# Patient Record
Sex: Male | Born: 1935 | Race: White | Hispanic: No | Smoking: Former smoker
Health system: Southern US, Community
[De-identification: ages and names within clinical notes are randomized; demographics above are authoritative.]

## PROBLEM LIST (undated history)

## (undated) DIAGNOSIS — R030 Elevated blood-pressure reading, without diagnosis of hypertension: Secondary | ICD-10-CM

## (undated) DIAGNOSIS — I2510 Atherosclerotic heart disease of native coronary artery without angina pectoris: Principal | ICD-10-CM

## (undated) DIAGNOSIS — N400 Enlarged prostate without lower urinary tract symptoms: Secondary | ICD-10-CM

## (undated) DIAGNOSIS — E785 Hyperlipidemia, unspecified: Secondary | ICD-10-CM

## (undated) DIAGNOSIS — Z87442 Personal history of urinary calculi: Secondary | ICD-10-CM

## (undated) DIAGNOSIS — I4891 Unspecified atrial fibrillation: Secondary | ICD-10-CM

## (undated) DIAGNOSIS — I340 Nonrheumatic mitral (valve) insufficiency: Secondary | ICD-10-CM

## (undated) HISTORY — DX: Benign prostatic hyperplasia without lower urinary tract symptoms: N40.0

## (undated) HISTORY — PX: TONSILECTOMY, ADENOIDECTOMY, BILATERAL MYRINGOTOMY AND TUBES: SHX2538

## (undated) HISTORY — DX: Elevated blood-pressure reading, without diagnosis of hypertension: R03.0

## (undated) HISTORY — DX: Unspecified atrial fibrillation: I48.91

## (undated) HISTORY — DX: Atherosclerotic heart disease of native coronary artery without angina pectoris: I25.10

## (undated) HISTORY — DX: Nonrheumatic mitral (valve) insufficiency: I34.0

## (undated) HISTORY — DX: Hyperlipidemia, unspecified: E78.5

## (undated) HISTORY — PX: CORONARY ARTERY BYPASS GRAFT: SHX141

## (undated) HISTORY — PX: FACIAL FRACTURE SURGERY: SHX1570

---

## 2004-04-24 ENCOUNTER — Ambulatory Visit: Payer: Self-pay | Admitting: Internal Medicine

## 2004-05-08 ENCOUNTER — Ambulatory Visit: Payer: Self-pay | Admitting: Internal Medicine

## 2004-05-09 ENCOUNTER — Ambulatory Visit: Payer: Self-pay | Admitting: Internal Medicine

## 2004-05-10 ENCOUNTER — Ambulatory Visit: Payer: Self-pay | Admitting: Internal Medicine

## 2004-05-10 ENCOUNTER — Encounter: Payer: Self-pay | Admitting: Internal Medicine

## 2004-05-10 LAB — HM COLONOSCOPY

## 2004-05-15 ENCOUNTER — Ambulatory Visit: Payer: Self-pay | Admitting: Internal Medicine

## 2004-05-22 ENCOUNTER — Ambulatory Visit: Payer: Self-pay | Admitting: Internal Medicine

## 2004-06-19 ENCOUNTER — Ambulatory Visit: Payer: Self-pay | Admitting: Internal Medicine

## 2004-07-17 ENCOUNTER — Ambulatory Visit: Payer: Self-pay | Admitting: Internal Medicine

## 2004-07-30 ENCOUNTER — Ambulatory Visit: Payer: Self-pay | Admitting: Internal Medicine

## 2005-02-27 ENCOUNTER — Ambulatory Visit: Payer: Self-pay | Admitting: Internal Medicine

## 2006-02-06 ENCOUNTER — Ambulatory Visit: Payer: Self-pay | Admitting: Internal Medicine

## 2007-12-16 ENCOUNTER — Ambulatory Visit: Payer: Self-pay | Admitting: Internal Medicine

## 2007-12-30 ENCOUNTER — Ambulatory Visit: Payer: Self-pay | Admitting: Internal Medicine

## 2007-12-30 DIAGNOSIS — E785 Hyperlipidemia, unspecified: Secondary | ICD-10-CM | POA: Insufficient documentation

## 2007-12-30 DIAGNOSIS — F528 Other sexual dysfunction not due to a substance or known physiological condition: Secondary | ICD-10-CM

## 2007-12-30 DIAGNOSIS — N400 Enlarged prostate without lower urinary tract symptoms: Secondary | ICD-10-CM | POA: Insufficient documentation

## 2008-01-05 ENCOUNTER — Telehealth (INDEPENDENT_AMBULATORY_CARE_PROVIDER_SITE_OTHER): Payer: Self-pay | Admitting: *Deleted

## 2008-01-06 LAB — CONVERTED CEMR LAB
AST: 26 units/L (ref 0–37)
BUN: 20 mg/dL (ref 6–23)
Basophils Absolute: 0.1 10*3/uL (ref 0.0–0.1)
Basophils Relative: 2.4 % (ref 0.0–3.0)
Calcium: 9.4 mg/dL (ref 8.4–10.5)
Chloride: 105 meq/L (ref 96–112)
Creatinine, Ser: 1.4 mg/dL (ref 0.4–1.5)
Eosinophils Absolute: 0.1 10*3/uL (ref 0.0–0.7)
Eosinophils Relative: 2.7 % (ref 0.0–5.0)
GFR calc Af Amer: 64 mL/min
GFR calc non Af Amer: 53 mL/min
HCT: 49 % (ref 39.0–52.0)
HDL: 38.4 mg/dL — ABNORMAL LOW (ref >39.0)
MCHC: 34.9 g/dL (ref 30.0–36.0)
MCV: 90.8 fL (ref 78.0–100.0)
Monocytes Absolute: 0.6 10*3/uL (ref 0.1–1.0)
Neutro Abs: 2.5 10*3/uL (ref 1.4–7.7)
Neutrophils Relative %: 50.9 % (ref 43.0–77.0)
PSA: 9.86 ng/mL — ABNORMAL HIGH (ref 0.10–4.00)
RBC: 5.4 M/uL (ref 4.22–5.81)
WBC: 4.8 10*3/uL (ref 4.5–10.5)

## 2008-01-11 ENCOUNTER — Telehealth: Payer: Self-pay | Admitting: Internal Medicine

## 2008-01-26 ENCOUNTER — Encounter: Payer: Self-pay | Admitting: Internal Medicine

## 2009-01-05 ENCOUNTER — Ambulatory Visit: Payer: Self-pay | Admitting: Internal Medicine

## 2009-05-19 ENCOUNTER — Ambulatory Visit: Payer: Self-pay | Admitting: Internal Medicine

## 2009-05-19 DIAGNOSIS — R030 Elevated blood-pressure reading, without diagnosis of hypertension: Secondary | ICD-10-CM

## 2009-05-19 DIAGNOSIS — I10 Essential (primary) hypertension: Secondary | ICD-10-CM

## 2009-05-19 HISTORY — DX: Elevated blood-pressure reading, without diagnosis of hypertension: R03.0

## 2009-06-05 ENCOUNTER — Encounter: Payer: Self-pay | Admitting: Internal Medicine

## 2010-01-08 ENCOUNTER — Ambulatory Visit: Payer: Self-pay | Admitting: Internal Medicine

## 2010-03-25 DIAGNOSIS — I340 Nonrheumatic mitral (valve) insufficiency: Secondary | ICD-10-CM

## 2010-03-25 HISTORY — PX: CARDIOVERSION: SHX1299

## 2010-03-25 HISTORY — DX: Nonrheumatic mitral (valve) insufficiency: I34.0

## 2010-04-22 LAB — CONVERTED CEMR LAB
Calcium: 9.6 mg/dL (ref 8.4–10.5)
Direct LDL: 176.9 mg/dL
GFR calc non Af Amer: 52.86 mL/min (ref >60)
Glucose, Bld: 76 mg/dL (ref 70–99)
HDL: 46 mg/dL (ref >39.00)
Potassium: 4.4 meq/L (ref 3.5–5.1)
Sodium: 143 meq/L (ref 135–145)
Triglycerides: 105 mg/dL (ref 0.0–149.0)
VLDL: 21 mg/dL (ref 0.0–40.0)

## 2010-04-24 NOTE — Assessment & Plan Note (Signed)
Summary: FLU SHOT//PH  Nurse Visit  CC: Flu shot./kb   Allergies: No Known Drug Allergies  Orders Added: 1)  Flu Vaccine 63yrs + MEDICARE PATIENTS [Q2039] 2)  Administration Flu vaccine - MCR [G0008]          Flu Vaccine Consent Questions     Do you have a history of severe allergic reactions to this vaccine? no    Any prior history of allergic reactions to egg and/or gelatin? no    Do you have a sensitivity to the preservative Thimersol? no    Do you have a past history of Guillan-Barre Syndrome? no    Do you currently have an acute febrile illness? no    Have you ever had a severe reaction to latex? no    Vaccine information given and explained to patient? yes    Are you currently pregnant? no    Lot Number:[id number]   Exp Date:09/22/2010   Site Given  Left Deltoid IM

## 2010-04-24 NOTE — Assessment & Plan Note (Signed)
Summary: yearly-- - jr   Vital Signs:  Patient profile:   76 year old male Height:      73 inches Weight:      201.6 pounds BMI:     26.69 Pulse rate:   74 / minute BP sitting:   160 / 100  (left arm)  Vitals Entered By: Doristine Devoid (May 19, 2009 1:10 PM) CC: CPX- pt fasting    History of Present Illness: BP elevated today-- ambulatory BPs taken daily, gets readings of 120 to 130 BPH-- last PSA 1-11 was 12.5  hyperlipidemia-- on no meds , has a healthy lifestyle  yearly, chart reviewed    Allergies: No Known Drug Allergies  Past History:  Past Medical History: Reviewed history from 12/30/2007 and no changes required. Hyperlipidemia BPH prostate Bx aprox 2005  Past Surgical History: Reviewed history from 12/30/2007 and no changes required. Tonsillectomy Facial Fracture  Family History: Reviewed history from 12/30/2007 and no changes required. F - DECEASED OLD AGE M- DECEASED OLD AGE CAD - no HTN - F, PGM DM - no stroke - no colon Ca - no prostate Ca - no  Social History: Married 2 children Retired diet-- healthy exercise -- active, does some weight lifting, golf  Review of Systems General:  Denies fever and weight loss. CV:  Denies chest pain or discomfort and swelling of feet. Resp:  Denies cough and shortness of breath. GI:  Denies bloody stools. GU:  Denies hematuria and urinary hesitancy. Psych:  Denies anxiety and depression.  Physical Exam  General:  alert, well-developed, and well-nourished.   Neck:  no thyromegaly and normal carotid upstroke.   Lungs:  normal respiratory effort, no intercostal retractions, no accessory muscle use, and normal breath sounds.   Heart:  normal rate, regular rhythm, and no murmur.   Abdomen:  soft, non-tender, no distention, no masses, and no guarding.   Extremities:  no low or extremity edema Psych:  Cognition and judgment appear intact. Alert and cooperative with normal attention span and  concentration.  not anxious appearing and not depressed appearing.     Impression & Recommendations:  Problem # 1:  BENIGN PROSTATIC HYPERTROPHY (ICD-600.00) elevated PSA, see HPI to have a Bx soon   Problem # 2:  HEALTH SCREENING (ICD-V70.0) Td < 10 years ago pneumonia shot 4 to 5 years ago , repeat one today printed material provided regards shingles shot   colonoscopy 05/10/2004, he had polyps and  internal hemorrhoids; biopsy show hyperplastic polyps, Dr Leone Payor next 10 years   cont. healthy life style  Problem # 3:  ELEVATED BLOOD PRESSURE WITHOUT DIAGNOSIS OF HYPERTENSION (ICD-796.2)  good ambulatory BPs, continue monitoring EKG normal  Orders: EKG w/ Interpretation (93000) TLB-BMP (Basic Metabolic Panel-BMET) (80048-METABOL)  Problem # 4:  HYPERLIPIDEMIA (ICD-272.4)  on lifestyle modification only Labs Reviewed: SGOT: 26 (12/30/2007)   SGPT: 32 (12/30/2007)   HDL:38.4 (12/30/2007)  LDL:DEL (12/30/2007)  Chol:213 (12/30/2007)  Trig:78 (12/30/2007)  Orders: Venipuncture (04540) TLB-Lipid Panel (80061-LIPID)  Problem # 5:  ERECTILE DYSFUNCTION (ICD-302.72) needs a RF His updated medication list for this problem includes:    Viagra 100 Mg Tabs (Sildenafil citrate) .Marland Kitchen... 1 by mouth once daily as needed  Complete Medication List: 1)  Viagra 100 Mg Tabs (Sildenafil citrate) .Marland Kitchen.. 1 by mouth once daily as needed  Other Orders: Pneumococcal Vaccine (98119) Admin 1st Vaccine (14782)  Patient Instructions: 1)  Please schedule a follow-up appointment in 1 year.  Prescriptions: VIAGRA 100 MG TABS (SILDENAFIL CITRATE)  1 by mouth once daily as needed  #10 x 6   Entered and Authorized by:   Elita Quick E. Ame Heagle MD   Signed by:   Nolon Rod. Mylea Roarty MD on 05/19/2009   Method used:   Print then Give to Patient   RxID:   [id number]    Immunizations Administered:  Pneumonia Vaccine:    Vaccine Type: Pneumovax    Site: right deltoid    Mfr: Merck    Dose: 0.5 ml    Route:  IM    Given by: Doristine Devoid    Exp. Date: 07/13/2010    Lot #: [id number]

## 2010-04-24 NOTE — Procedures (Signed)
Summary: Colonoscopy/[facility]  Colonoscopy/[facility]   Imported By: Lanelle Bal 05/25/2009 14:29:33  _____________________________________________________________________  External Attachment:    Type:   Image     Comment:   External Document

## 2010-08-02 DIAGNOSIS — I2510 Atherosclerotic heart disease of native coronary artery without angina pectoris: Secondary | ICD-10-CM

## 2010-12-11 DIAGNOSIS — I2510 Atherosclerotic heart disease of native coronary artery without angina pectoris: Secondary | ICD-10-CM

## 2011-01-29 ENCOUNTER — Encounter: Payer: Self-pay | Admitting: Thoracic Surgery (Cardiothoracic Vascular Surgery)

## 2011-01-29 ENCOUNTER — Ambulatory Visit (INDEPENDENT_AMBULATORY_CARE_PROVIDER_SITE_OTHER): Payer: Medicare Other | Admitting: Physician Assistant

## 2011-01-29 DIAGNOSIS — I2510 Atherosclerotic heart disease of native coronary artery without angina pectoris: Secondary | ICD-10-CM

## 2011-01-29 DIAGNOSIS — Z951 Presence of aortocoronary bypass graft: Secondary | ICD-10-CM

## 2011-01-29 NOTE — Progress Notes (Signed)
[name] return to the office today for scheduled followup after five-vessel coronary bypass grafting by Dr. Arvilla Buchanan on 12/10/2010. He had severe multivessel coronary disease included a totally occluded right coronary artery. His ejection fraction was 50% prior to  surgery and following cardiac pulmonary bypass. His postoperative course was uneventful except for some atrial fibrillation which was treated with amiodarone. Since his discharge home he is continuing to progress. He denies any chest pain or shortness of breath. He tells me he was seen by Dr. Tollie Buchanan within the past few weeks and his amiodarone dose was increased to 3 times daily. He was also started on diuretic because he had some lower extremity swelling.  On physical exam: [name] is in no acute distress. His heart is in a regular rate and rhythm. His breath sounds are clear to auscultation. Sternotomy incision is well healed and the sternum is stable. Chest tube sutures are removed today. He has no edema in the left leg from where the saphenous vein was harvested. He does have some edema in the right leg. He has no calf or thigh tenderness. He denies any pain in either extremity.  Impression: Overall [name] is making satisfactory recovery after five-vessel coronary bypass grafting. He has scheduled followup with Dr. Reuel Buchanan next month. He did not bring his medications with him today but he reports no changes other than those mentioned above. No further followup scheduled in our office but he understands we would be happy to see him should any other problems arise related to his surgery.

## 2011-02-21 ENCOUNTER — Ambulatory Visit (INDEPENDENT_AMBULATORY_CARE_PROVIDER_SITE_OTHER): Payer: Medicare Other

## 2011-02-21 DIAGNOSIS — Z23 Encounter for immunization: Secondary | ICD-10-CM

## 2011-04-02 DIAGNOSIS — I252 Old myocardial infarction: Secondary | ICD-10-CM | POA: Diagnosis not present

## 2011-04-02 DIAGNOSIS — Z951 Presence of aortocoronary bypass graft: Secondary | ICD-10-CM | POA: Diagnosis not present

## 2011-04-02 DIAGNOSIS — I059 Rheumatic mitral valve disease, unspecified: Secondary | ICD-10-CM | POA: Diagnosis not present

## 2011-04-02 DIAGNOSIS — I2510 Atherosclerotic heart disease of native coronary artery without angina pectoris: Secondary | ICD-10-CM | POA: Diagnosis not present

## 2011-04-02 DIAGNOSIS — I4891 Unspecified atrial fibrillation: Secondary | ICD-10-CM | POA: Diagnosis not present

## 2011-05-20 ENCOUNTER — Telehealth: Payer: Self-pay | Admitting: *Deleted

## 2011-05-20 NOTE — Telephone Encounter (Signed)
Pt came in & filled out a "walk-in pt" form to let you know he had a heart attack in may & had to have bypass surgery in September. Pt states he will be making an appt in a few months to see you.

## 2011-05-20 NOTE — Telephone Encounter (Signed)
Noted, thank you

## 2011-08-09 ENCOUNTER — Ambulatory Visit (INDEPENDENT_AMBULATORY_CARE_PROVIDER_SITE_OTHER): Payer: Medicare Other | Admitting: Internal Medicine

## 2011-08-09 VITALS — BP 130/84 | HR 46 | Temp 97.9°F | Wt 180.0 lb

## 2011-08-09 DIAGNOSIS — E785 Hyperlipidemia, unspecified: Secondary | ICD-10-CM

## 2011-08-09 DIAGNOSIS — R030 Elevated blood-pressure reading, without diagnosis of hypertension: Secondary | ICD-10-CM | POA: Diagnosis not present

## 2011-08-09 DIAGNOSIS — N400 Enlarged prostate without lower urinary tract symptoms: Secondary | ICD-10-CM | POA: Diagnosis not present

## 2011-08-09 DIAGNOSIS — I2510 Atherosclerotic heart disease of native coronary artery without angina pectoris: Secondary | ICD-10-CM

## 2011-08-09 HISTORY — DX: Atherosclerotic heart disease of native coronary artery without angina pectoris: I25.10

## 2011-08-09 LAB — BASIC METABOLIC PANEL
BUN: 33 mg/dL — ABNORMAL HIGH (ref 6–23)
CO2: 23 mEq/L (ref 19–32)
Calcium: 9.3 mg/dL (ref 8.4–10.5)
Chloride: 112 mEq/L (ref 96–112)
Creatinine, Ser: 1.9 mg/dL — ABNORMAL HIGH (ref 0.4–1.5)
GFR: 37.16 mL/min — ABNORMAL LOW (ref >60.00)
Glucose, Bld: 72 mg/dL (ref 70–99)
Potassium: 4.4 mEq/L (ref 3.5–5.1)
Sodium: 142 mEq/L (ref 135–145)

## 2011-08-09 LAB — CBC WITH DIFFERENTIAL/PLATELET
Basophils Relative: 0.6 % (ref 0.0–3.0)
Eosinophils Absolute: 0.2 10*3/uL (ref 0.0–0.7)
HCT: 39.7 % (ref 39.0–52.0)
Hemoglobin: 13.2 g/dL (ref 13.0–17.0)
Lymphs Abs: 1.2 10*3/uL (ref 0.7–4.0)
MCHC: 33.3 g/dL (ref 30.0–36.0)
MCV: 89.4 fl (ref 78.0–100.0)
Monocytes Absolute: 0.5 10*3/uL (ref 0.1–1.0)
Neutro Abs: 3.6 10*3/uL (ref 1.4–7.7)
Neutrophils Relative %: 65.6 % (ref 43.0–77.0)
RBC: 4.44 Mil/uL (ref 4.22–5.81)

## 2011-08-09 LAB — ALT: ALT: 23 U/L (ref 0–53)

## 2011-08-09 LAB — TSH: TSH: 0.47 u[IU]/mL (ref 0.35–5.50)

## 2011-08-09 LAB — LIPID PANEL
LDL Cholesterol: 49 mg/dL (ref 0–99)
Total CHOL/HDL Ratio: 2

## 2011-08-09 NOTE — Progress Notes (Signed)
  Subjective:    Patient ID: Adam Buchanan., male    DOB: Jun 17, 1934, 77 y.o.   MRN: [id number]  HPI ROV here w/ wife  Last OV 2 years ago, PMH updated  Since last OV, he  had a MI f/u by stents, then a CABG Also developed Afib, s/p cardioversion 12-12, now doing well  Has seen urology, Dr Vonita Moss regularly, good reports   Hyperlipidemia, good med compliance  Request labs  Past Medical History: CAD, MI 07-2010, stents, then CABG 9-12 Paroxsymal Afib, cardioversion 02-2011  Hyperlipidemia BPH, prostate Bx aprox 2005  Past Surgical History: Tonsillectomy Facial Fracture CABG 9-12  Family History: F - DECEASED OLD AGE M- DECEASED OLD AGE CAD - no HTN - F, PGM DM - no stroke - no colon Ca - no prostate Ca - no  Social History: Married, 2 children, retired Tobacco-- quit in the 90s, was alight smoker ETOH-- socially   Review of Systems No chest pain or shortness of breath Note nausea, vomiting, diarrhea. Has not needed to use nitroglycerin at all. He was noted to have some right ankle swelling, status is going on since approximately the time of heart surgery in September, the edema is actually getting better.    Objective:   Physical Exam General -- alert, well-developed, and well-nourished.   Neck --no thyromegaly , normal carotid pulse Lungs -- normal respiratory effort, no intercostal retractions, no accessory muscle use, and normal breath sounds.   Heart-- normal rate, regular rhythm, no murmur, and no gallop.   Abdomen--soft, non-tender, no distention, no masses, no HSM, no guarding, and no rigidity.   Extremities--  Mild edema around the R ankle otherwise no pretibial edema Neurologic-- alert & oriented X3 and strength normal in all extremities. Psych-- Cognition and judgment appear intact. Alert and cooperative with normal attention span and concentration.  not anxious appearing and not depressed appearing.       Assessment & Plan:

## 2011-08-09 NOTE — Assessment & Plan Note (Signed)
On Lasix and lisinopril,good BP

## 2011-08-09 NOTE — Assessment & Plan Note (Signed)
Good medication compliance, labs 

## 2011-08-09 NOTE — Assessment & Plan Note (Signed)
S/p a  MI 07-2010, stents, then CABG 9-12 Also developed Paroxsymal Afib, cardioversion 02-2011  Fortunately he is doing very well. We'll check labs and send them to Dr. Tollie Pizza @ [facility]

## 2011-08-11 ENCOUNTER — Encounter: Payer: Self-pay | Admitting: Internal Medicine

## 2011-08-11 NOTE — Assessment & Plan Note (Signed)
Per u rology 

## 2011-11-14 ENCOUNTER — Telehealth: Payer: Self-pay | Admitting: Internal Medicine

## 2011-11-14 NOTE — Telephone Encounter (Signed)
Discussed with pt

## 2011-11-14 NOTE — Telephone Encounter (Signed)
Pt stopped by check out & stated he has some questions about his last labs Cb# [phone]

## 2011-11-27 DIAGNOSIS — I4891 Unspecified atrial fibrillation: Secondary | ICD-10-CM | POA: Diagnosis not present

## 2011-11-27 DIAGNOSIS — I252 Old myocardial infarction: Secondary | ICD-10-CM | POA: Diagnosis not present

## 2011-11-27 DIAGNOSIS — R609 Edema, unspecified: Secondary | ICD-10-CM | POA: Diagnosis not present

## 2011-11-27 DIAGNOSIS — I2510 Atherosclerotic heart disease of native coronary artery without angina pectoris: Secondary | ICD-10-CM | POA: Diagnosis not present

## 2011-11-27 DIAGNOSIS — I059 Rheumatic mitral valve disease, unspecified: Secondary | ICD-10-CM | POA: Diagnosis not present

## 2012-01-29 ENCOUNTER — Ambulatory Visit (INDEPENDENT_AMBULATORY_CARE_PROVIDER_SITE_OTHER): Payer: Medicare Other

## 2012-01-29 DIAGNOSIS — Z23 Encounter for immunization: Secondary | ICD-10-CM | POA: Diagnosis not present

## 2012-02-10 ENCOUNTER — Ambulatory Visit (INDEPENDENT_AMBULATORY_CARE_PROVIDER_SITE_OTHER): Payer: Medicare Other | Admitting: Internal Medicine

## 2012-02-10 ENCOUNTER — Encounter: Payer: Self-pay | Admitting: Internal Medicine

## 2012-02-10 VITALS — BP 110/64 | HR 56 | Temp 97.9°F | Ht 72.25 in | Wt 185.0 lb

## 2012-02-10 DIAGNOSIS — R030 Elevated blood-pressure reading, without diagnosis of hypertension: Secondary | ICD-10-CM | POA: Diagnosis not present

## 2012-02-10 DIAGNOSIS — E785 Hyperlipidemia, unspecified: Secondary | ICD-10-CM | POA: Diagnosis not present

## 2012-02-10 DIAGNOSIS — I2510 Atherosclerotic heart disease of native coronary artery without angina pectoris: Secondary | ICD-10-CM | POA: Diagnosis not present

## 2012-02-10 DIAGNOSIS — N400 Enlarged prostate without lower urinary tract symptoms: Secondary | ICD-10-CM

## 2012-02-10 DIAGNOSIS — N189 Chronic kidney disease, unspecified: Secondary | ICD-10-CM

## 2012-02-10 DIAGNOSIS — N19 Unspecified kidney failure: Secondary | ICD-10-CM | POA: Insufficient documentation

## 2012-02-10 LAB — LIPID PANEL
HDL: 44.5 mg/dL (ref >39.00)
LDL Cholesterol: 86 mg/dL (ref 0–99)
Total CHOL/HDL Ratio: 3

## 2012-02-10 LAB — COMPREHENSIVE METABOLIC PANEL
ALT: 23 U/L (ref 0–53)
AST: 20 U/L (ref 0–37)
Albumin: 4.4 g/dL (ref 3.5–5.2)
Alkaline Phosphatase: 62 U/L (ref 39–117)
Calcium: 9.4 mg/dL (ref 8.4–10.5)
Chloride: 106 mEq/L (ref 96–112)
Potassium: 4.9 mEq/L (ref 3.5–5.1)

## 2012-02-10 LAB — CBC WITH DIFFERENTIAL/PLATELET
Basophils Relative: 0.6 % (ref 0.0–3.0)
Eosinophils Absolute: 0.1 10*3/uL (ref 0.0–0.7)
Eosinophils Relative: 1.8 % (ref 0.0–5.0)
HCT: 45.9 % (ref 39.0–52.0)
Hemoglobin: 15.2 g/dL (ref 13.0–17.0)
Lymphs Abs: 1.3 10*3/uL (ref 0.7–4.0)
MCHC: 33.2 g/dL (ref 30.0–36.0)
MCV: 90.9 fl (ref 78.0–100.0)
Monocytes Absolute: 0.6 10*3/uL (ref 0.1–1.0)
Neutro Abs: 3.8 10*3/uL (ref 1.4–7.7)
RBC: 5.05 Mil/uL (ref 4.22–5.81)

## 2012-02-10 NOTE — Progress Notes (Signed)
  Subjective:    Patient ID: Adam Faster., male    DOB: 1934-09-04, 77 y.o.   MRN: [id number]  HPI Routine office visit Saw cardiology recently, no medication changes, he asked him to have his cholesterol checked here. Elevated BP, good medication compliance High cholesterol, good medication compliance without apparent side effects  Past Medical History: CAD, MI 07-2010, stents, then CABG 9-12 Paroxsymal Afib, cardioversion 02-2011  Hyperlipidemia BPH, prostate Bx aprox 2005  Past Surgical History: Tonsillectomy Facial Fracture CABG 9-12  Family History: F - DECEASED OLD AGE M- DECEASED OLD AGE CAD - no HTN - F, PGM DM - no stroke - no colon Ca - no prostate Ca - no  Social History: Married, 2 children, retired Tobacco-- quit in the 90s, was alight smoker ETOH-- socially    Review of Systems Denies chest pain or shortness of breath No nausea, vomiting, diarrhea He exercises daily and has a healthy diet, feels really well. Had a flu shot already    Objective:   Physical Exam General -- alert, well-developed, and well-nourished.    Lungs -- normal respiratory effort, no intercostal retractions, no accessory muscle use, and normal breath sounds.   Heart-- normal rate, regular rhythm, no murmur, and no gallop.   extremities-- no pretibial edema bilaterally Psych-- Cognition and judgment appear intact. Alert and cooperative with normal attention span and concentration.  not anxious appearing and not depressed appearing.       Assessment & Plan:

## 2012-02-10 NOTE — Assessment & Plan Note (Signed)
BP is excellent. 

## 2012-02-10 NOTE — Patient Instructions (Addendum)
Continue taking the same medications, next visit in 6 months, fasting for a yearly checkup

## 2012-02-10 NOTE — Assessment & Plan Note (Signed)
On Lipitor 80, good compliance, no apparent side effects. Labs

## 2012-02-10 NOTE — Assessment & Plan Note (Signed)
Last creatinine 1.9, I don't have recent labs however the discharge summary from [facility] 07-2010 reports a normal creatinine. Plan: Labs, if renal failure persist will refer to nephrology in [facility].

## 2012-02-10 NOTE — Assessment & Plan Note (Signed)
Saw cardiology 11/2011, was doing well, they did not do any blood work, was recommended to have her cholesterol checked here

## 2012-02-10 NOTE — Assessment & Plan Note (Signed)
Reports Dr. Vonita Moss did a biopsy before his MI in 2012. Currently asymptomatic

## 2012-02-12 ENCOUNTER — Encounter: Payer: Self-pay | Admitting: *Deleted

## 2012-02-12 NOTE — Addendum Note (Signed)
Addended by: Edwena Felty T on: 02/12/2012 08:12 AM   Modules accepted: Orders

## 2012-04-29 DIAGNOSIS — I10 Essential (primary) hypertension: Secondary | ICD-10-CM | POA: Diagnosis not present

## 2012-05-11 ENCOUNTER — Encounter (INDEPENDENT_AMBULATORY_CARE_PROVIDER_SITE_OTHER): Payer: Medicare Other

## 2012-05-11 DIAGNOSIS — I700 Atherosclerosis of aorta: Secondary | ICD-10-CM

## 2012-05-11 DIAGNOSIS — I701 Atherosclerosis of renal artery: Secondary | ICD-10-CM

## 2012-06-19 DIAGNOSIS — N2581 Secondary hyperparathyroidism of renal origin: Secondary | ICD-10-CM | POA: Diagnosis not present

## 2012-06-30 DIAGNOSIS — I129 Hypertensive chronic kidney disease with stage 1 through stage 4 chronic kidney disease, or unspecified chronic kidney disease: Secondary | ICD-10-CM | POA: Diagnosis not present

## 2012-08-03 DIAGNOSIS — I4891 Unspecified atrial fibrillation: Secondary | ICD-10-CM | POA: Diagnosis not present

## 2012-08-03 DIAGNOSIS — I059 Rheumatic mitral valve disease, unspecified: Secondary | ICD-10-CM | POA: Diagnosis not present

## 2012-08-03 DIAGNOSIS — I252 Old myocardial infarction: Secondary | ICD-10-CM | POA: Diagnosis not present

## 2012-08-03 DIAGNOSIS — R609 Edema, unspecified: Secondary | ICD-10-CM | POA: Diagnosis not present

## 2012-08-03 DIAGNOSIS — I2510 Atherosclerotic heart disease of native coronary artery without angina pectoris: Secondary | ICD-10-CM | POA: Diagnosis not present

## 2012-12-22 ENCOUNTER — Ambulatory Visit (INDEPENDENT_AMBULATORY_CARE_PROVIDER_SITE_OTHER): Payer: Medicare Other

## 2012-12-22 DIAGNOSIS — Z23 Encounter for immunization: Secondary | ICD-10-CM

## 2013-06-07 ENCOUNTER — Telehealth: Payer: Self-pay

## 2013-06-07 NOTE — Telephone Encounter (Addendum)
Left message for call back Non identifiable  Medication List and allergies:  Reviewed and updated  90 day supply/mail order: na [facility] [address] [place]  Immunizations due: shingles and tdap  A/P:   No changes to FH, PSH or Personal Hx Flu vaccine--11/2012 PNA--04/2009 Shingles--declines Tdap--hasn't had one in some time [facility]--04/2004--Dr Gessner--hyperplastic polyps--next due 2016 PSA--01/2008--9.86--referred to [facility]  To Discuss with Provider: Not at this time

## 2013-06-08 ENCOUNTER — Ambulatory Visit (INDEPENDENT_AMBULATORY_CARE_PROVIDER_SITE_OTHER): Payer: Medicare Other | Admitting: Internal Medicine

## 2013-06-08 ENCOUNTER — Encounter: Payer: Self-pay | Admitting: Internal Medicine

## 2013-06-08 ENCOUNTER — Telehealth: Payer: Self-pay | Admitting: *Deleted

## 2013-06-08 VITALS — BP 127/74 | HR 60 | Temp 97.9°F | Ht 71.5 in | Wt 189.0 lb

## 2013-06-08 DIAGNOSIS — Z125 Encounter for screening for malignant neoplasm of prostate: Secondary | ICD-10-CM | POA: Diagnosis not present

## 2013-06-08 DIAGNOSIS — Z0000 Encounter for general adult medical examination without abnormal findings: Secondary | ICD-10-CM

## 2013-06-08 DIAGNOSIS — N19 Unspecified kidney failure: Secondary | ICD-10-CM

## 2013-06-08 DIAGNOSIS — R030 Elevated blood-pressure reading, without diagnosis of hypertension: Secondary | ICD-10-CM

## 2013-06-08 DIAGNOSIS — E785 Hyperlipidemia, unspecified: Secondary | ICD-10-CM

## 2013-06-08 DIAGNOSIS — I2510 Atherosclerotic heart disease of native coronary artery without angina pectoris: Secondary | ICD-10-CM

## 2013-06-08 DIAGNOSIS — N400 Enlarged prostate without lower urinary tract symptoms: Secondary | ICD-10-CM | POA: Diagnosis not present

## 2013-06-08 DIAGNOSIS — H9190 Unspecified hearing loss, unspecified ear: Secondary | ICD-10-CM

## 2013-06-08 LAB — CBC WITH DIFFERENTIAL/PLATELET
Basophils Absolute: 0 10*3/uL (ref 0.0–0.1)
Basophils Relative: 0.5 % (ref 0.0–3.0)
Eosinophils Absolute: 0.1 10*3/uL (ref 0.0–0.7)
Eosinophils Relative: 2 % (ref 0.0–5.0)
HCT: 43.6 % (ref 39.0–52.0)
Hemoglobin: 14.5 g/dL (ref 13.0–17.0)
Lymphocytes Relative: 19 % (ref 12.0–46.0)
Lymphs Abs: 1.1 10*3/uL (ref 0.7–4.0)
MCHC: 33.2 g/dL (ref 30.0–36.0)
MCV: 89.9 fl (ref 78.0–100.0)
Monocytes Absolute: 0.6 10*3/uL (ref 0.1–1.0)
Monocytes Relative: 9.8 % (ref 3.0–12.0)
Neutro Abs: 4.2 10*3/uL (ref 1.4–7.7)
Neutrophils Relative %: 68.7 % (ref 43.0–77.0)
Platelets: 198 10*3/uL (ref 150.0–400.0)
RBC: 4.85 Mil/uL (ref 4.22–5.81)
RDW: 14.6 % (ref 11.5–14.6)
WBC: 6.1 10*3/uL (ref 4.5–10.5)

## 2013-06-08 LAB — LIPID PANEL
Cholesterol: 136 mg/dL (ref 0–200)
HDL: 46.7 mg/dL (ref >39.00)
LDL Cholesterol: 77 mg/dL (ref 0–99)
Total CHOL/HDL Ratio: 3
Triglycerides: 63 mg/dL (ref 0.0–149.0)
VLDL: 12.6 mg/dL (ref 0.0–40.0)

## 2013-06-08 LAB — PSA: PSA: 12.11 ng/mL — ABNORMAL HIGH (ref 0.10–4.00)

## 2013-06-08 LAB — COMPREHENSIVE METABOLIC PANEL
ALT: 23 U/L (ref 0–53)
AST: 20 U/L (ref 0–37)
Albumin: 4 g/dL (ref 3.5–5.2)
Alkaline Phosphatase: 75 U/L (ref 39–117)
BUN: 27 mg/dL — ABNORMAL HIGH (ref 6–23)
CALCIUM: 9.5 mg/dL (ref 8.4–10.5)
CHLORIDE: 109 meq/L (ref 96–112)
CO2: 25 meq/L (ref 19–32)
CREATININE: 1.7 mg/dL — AB (ref 0.4–1.5)
GFR: 42.95 mL/min — AB (ref >60.00)
Glucose, Bld: 79 mg/dL (ref 70–99)
Potassium: 4.5 mEq/L (ref 3.5–5.1)
Sodium: 141 mEq/L (ref 135–145)
TOTAL PROTEIN: 7 g/dL (ref 6.0–8.3)
Total Bilirubin: 0.9 mg/dL (ref 0.3–1.2)

## 2013-06-08 NOTE — Telephone Encounter (Signed)
Pts wife called stating that she would like Dr.PAZ to know that pt has been having difficulties hearing and following directions. Pt currently at appointment , wife could not make it, just wanted dr Drue Novelpaz to know incase pt forgot.

## 2013-06-08 NOTE — Assessment & Plan Note (Addendum)
Td < than 10 years per pt  PNM shot 2011 Prevnar recommended, not available today zostavax-- declined   colonoscopy 05/10/2004, he had polyps and  internal hemorrhoids; biopsy show hyperplastic polyps, Dr Leone PayorGessner next 10 years   Diet-exercise discussed

## 2013-06-08 NOTE — Progress Notes (Signed)
Subjective:    Patient ID: Adam Fasterobert J Haaland Jr., male    DOB: Mar 17, 1935, 79 y.o.   MRN: [id number]  DOS:  06/08/2013 Type of  visit: Here for Medicare AWV: 1. Risk factors based on Past M, S, F history: reviewed 2. Physical Activities: goes to the gym, yard work 3. Depression/mood: neg screening  4. Hearing: chronically decreased for certain frequencies, offered referral----will arrange    5. ADL's:  Independent  6. Fall Risk: no recent falls, counseled 7. home Safety: does feel safe at home  8. Height, weight, & visual acuity: see VS, Reports no problems with his vision 9. Counseling: provided 10. Labs ordered based on risk factors: if needed  11. Referral Coordination: if needed 12. Care Plan, see assessment and plan  13. Cognitive Assessment: Cognition and motor skills appropriate for age  In addition, today we discussed the following: CAD-- Saw Dr. Garner Nashaniels cardiology 07/2012, note reviewed, stable, followup one year High cholesterol, good compliance with Lipitor Hypertension, good compliance with medications, ambulatory BPs usually 130/70. H/o chronic renal insufficiency, last visit with nephrology~ a year ago  ROS No  CP, SOB No palpitations, no lower extremity edema Denies  nausea, vomiting diarrhea Denies  blood in the stools (-) cough, sputum production (-) wheezing, chest congestion No dysuria, gross hematuria, difficulty urinating     Family History: F - DECEASED OLD AGE M- DECEASED OLD AGE CAD - no HTN - F, PGM DM - no stroke - no colon Ca - no prostate Ca - no    Past Medical History  Diagnosis Date  . CAD (coronary artery disease) 08/09/2011    CAD, MI 07-2010, stents, then CABG 9-12  Cards -- f/u in [place]    . ELEVATED BLOOD PRESSURE WITHOUT DIAGNOSIS OF HYPERTENSION 05/19/2009    Qualifier: Diagnosis of  By: Drue NovelPaz MD, Miosha Behe E.   . Hyperlipidemia   . BPH (benign prostatic hyperplasia)     Past Surgical History  Procedure Laterality Date  . Coronary  artery bypass graft      (x5) 12-10-2010  . Tonsilectomy, adenoidectomy, bilateral myringotomy and tubes    . Facial fracture surgery      History   Social History  . Marital Status: Married    Spouse Name: N/A    Number of Children: N/A  . Years of Education: N/A   Occupational History  . Not on file.   Social History Main Topics  . Smoking status: Never Smoker   . Smokeless tobacco: Not on file  . Alcohol Use: Yes  . Drug Use: Not on file  . Sexual Activity: Not on file   Other Topics Concern  . Not on file   Social History Narrative  . No narrative on file        Medication List       This list is accurate as of: 06/08/13 11:59 PM.  Always use your most recent med list.               amiodarone 200 MG tablet  Commonly known as:  PACERONE  Take 200 mg by mouth daily.     aspirin 81 MG tablet  Take 81 mg by mouth daily.     atorvastatin 80 MG tablet  Commonly known as:  LIPITOR  Take 80 mg by mouth daily.     furosemide 20 MG tablet  Commonly known as:  LASIX  Take 20 mg by mouth daily.     lisinopril 10 MG  tablet  Commonly known as:  PRINIVIL,ZESTRIL  Take 10 mg by mouth daily.     multivitamin tablet  Take 1 tablet by mouth daily.     nitroGLYCERIN 0.4 MG SL tablet  Commonly known as:  NITROSTAT  Place 0.4 mg under the tongue every 5 (five) minutes as needed.           Objective:   Physical Exam BP 127/74  Pulse 60  Temp(Src) 97.9 F (36.6 C)  Ht 5' 11.5" (1.816 m)  Wt 189 lb (85.73 kg)  BMI 26.00 kg/m2  SpO2 99%  General -- alert, well-developed, NAD.  Neck --no thyromegaly , normal carotid pulse  HEENT-- Not pale.  Lungs -- normal respiratory effort, no intercostal retractions, no accessory muscle use, and normal breath sounds.  Heart-- bradycardic,  no murmur.  Abdomen-- Not distended, good bowel sounds,soft, non-tender. Rectal-- No external abnormalities noted. Normal sphincter tone. No rectal masses or tenderness. Brown  stool   Prostate--Prostate gland firm and smooth, ++ enlargement, no nodularity, tenderness,  asymmetry or induration. Extremities-- no pretibial edema bilaterally  Neurologic--  alert & oriented X3. Speech normal, gait normal, strength normal in all extremities.  Psych-- Cognition and judgment appear intact. Cooperative with normal attention span and concentration. No anxious or depressed appearing.      Assessment & Plan:

## 2013-06-08 NOTE — Assessment & Plan Note (Signed)
PSA--01/2008--9.86--referred to [facility]  Has had a biopsy in 2012, Dr Vonita MossPeterson, no records in the chart. DRE today showed a large prostate today, check a PSA

## 2013-06-08 NOTE — Assessment & Plan Note (Addendum)
BPs normal on Lasix and lisinopril. No change

## 2013-06-08 NOTE — Patient Instructions (Signed)
Get your blood work before you leave    Next visit is for routine check up regards your  blood pressure and heart in 6 months  No need to come back fasting Please make an appointment       Fall Prevention and Home Safety Falls cause injuries and can affect all age groups. It is possible to use preventive measures to significantly decrease the likelihood of falls. There are many simple measures which can make your home safer and prevent falls. OUTDOORS  Repair cracks and edges of walkways and driveways.  Remove high doorway thresholds.  Trim shrubbery on the main path into your home.  Have good outside lighting.  Clear walkways of tools, rocks, debris, and clutter.  Check that handrails are not broken and are securely fastened. Both sides of steps should have handrails.  Have leaves, snow, and ice cleared regularly.  Use sand or salt on walkways during winter months.  In the garage, clean up grease or oil spills. BATHROOM  Install night lights.  Install grab bars by the toilet and in the tub and shower.  Use non-skid mats or decals in the tub or shower.  Place a plastic non-slip stool in the shower to sit on, if needed.  Keep floors dry and clean up all water on the floor immediately.  Remove soap buildup in the tub or shower on a regular basis.  Secure bath mats with non-slip, double-sided rug tape.  Remove throw rugs and tripping hazards from the floors. BEDROOMS  Install night lights.  Make sure a bedside light is easy to reach.  Do not use oversized bedding.  Keep a telephone by your bedside.  Have a firm chair with side arms to use for getting dressed.  Remove throw rugs and tripping hazards from the floor. KITCHEN  Keep handles on pots and pans turned toward the center of the stove. Use back burners when possible.  Clean up spills quickly and allow time for drying.  Avoid walking on wet floors.  Avoid hot utensils and knives.  Position  shelves so they are not too high or low.  Place commonly used objects within easy reach.  If necessary, use a sturdy step stool with a grab bar when reaching.  Keep electrical cables out of the way.  Do not use floor polish or wax that makes floors slippery. If you must use wax, use non-skid floor wax.  Remove throw rugs and tripping hazards from the floor. STAIRWAYS  Never leave objects on stairs.  Place handrails on both sides of stairways and use them. Fix any loose handrails. Make sure handrails on both sides of the stairways are as long as the stairs.  Check carpeting to make sure it is firmly attached along stairs. Make repairs to worn or loose carpet promptly.  Avoid placing throw rugs at the top or bottom of stairways, or properly secure the rug with carpet tape to prevent slippage. Get rid of throw rugs, if possible.  Have an electrician put in a light switch at the top and bottom of the stairs. OTHER FALL PREVENTION TIPS  Wear low-heel or rubber-soled shoes that are supportive and fit well. Wear closed toe shoes.  When using a stepladder, make sure it is fully opened and both spreaders are firmly locked. Do not climb a closed stepladder.  Add color or contrast paint or tape to grab bars and handrails in your home. Place contrasting color strips on first and last steps.  Learn and  use mobility aids as needed. Install an electrical emergency response system.  Turn on lights to avoid dark areas. Replace light bulbs that burn out immediately. Get light switches that glow.  Arrange furniture to create clear pathways. Keep furniture in the same place.  Firmly attach carpet with non-skid or double-sided tape.  Eliminate uneven floor surfaces.  Select a carpet pattern that does not visually hide the edge of steps.  Be aware of all pets. OTHER HOME SAFETY TIPS  Set the water temperature for 120 F (48.8 C).  Keep emergency numbers on or near the telephone.  Keep  smoke detectors on every level of the home and near sleeping areas. Document Released: 03/01/2002 Document Revised: 09/10/2011 Document Reviewed: 05/31/2011 [facility] Patient Information 2014 [place].

## 2013-06-08 NOTE — Telephone Encounter (Signed)
We are referring him for a hearing test

## 2013-06-08 NOTE — Assessment & Plan Note (Signed)
Saw nephrology in 2014, labs today

## 2013-06-08 NOTE — Assessment & Plan Note (Addendum)
Last visit with cardiology 07/2012, not reviewed, felt to be stable, Pradaxa was discontinued. EKG today HR 49, asx, sinus rythm

## 2013-06-08 NOTE — Assessment & Plan Note (Signed)
Good compliance with Lipitor, labs 

## 2013-06-09 ENCOUNTER — Encounter: Payer: Self-pay | Admitting: Internal Medicine

## 2013-06-12 ENCOUNTER — Other Ambulatory Visit: Payer: Self-pay | Admitting: Internal Medicine

## 2013-06-12 DIAGNOSIS — N19 Unspecified kidney failure: Secondary | ICD-10-CM

## 2013-06-14 ENCOUNTER — Telehealth: Payer: Self-pay | Admitting: Internal Medicine

## 2013-06-14 NOTE — Telephone Encounter (Signed)
Spoke with patient and advised of lab results and reason for referral. Patient states understanding

## 2013-06-14 NOTE — Telephone Encounter (Signed)
Patient called and wanted to know why he was having a RENAL ARTERY STENOSISI done. Please advise

## 2013-06-15 ENCOUNTER — Telehealth: Payer: Self-pay

## 2013-06-15 DIAGNOSIS — R972 Elevated prostate specific antigen [PSA]: Secondary | ICD-10-CM

## 2013-06-15 NOTE — Telephone Encounter (Signed)
Patient notified of labs and referral to urology

## 2013-06-18 ENCOUNTER — Ambulatory Visit
Admission: RE | Admit: 2013-06-18 | Discharge: 2013-06-18 | Disposition: A | Discharge status: Home / Self Care | Payer: Medicare Other | Source: Ambulatory Visit | Attending: Internal Medicine | Admitting: Internal Medicine

## 2013-06-18 DIAGNOSIS — N19 Unspecified kidney failure: Secondary | ICD-10-CM

## 2013-06-20 ENCOUNTER — Encounter: Payer: Self-pay | Admitting: Internal Medicine

## 2013-06-20 DIAGNOSIS — I779 Disorder of arteries and arterioles, unspecified: Secondary | ICD-10-CM | POA: Insufficient documentation

## 2013-06-21 ENCOUNTER — Encounter: Payer: Self-pay | Admitting: *Deleted

## 2013-06-23 DIAGNOSIS — N139 Obstructive and reflux uropathy, unspecified: Secondary | ICD-10-CM | POA: Diagnosis not present

## 2013-06-23 DIAGNOSIS — N138 Other obstructive and reflux uropathy: Secondary | ICD-10-CM | POA: Diagnosis not present

## 2013-06-23 DIAGNOSIS — R972 Elevated prostate specific antigen [PSA]: Secondary | ICD-10-CM | POA: Diagnosis not present

## 2013-06-23 DIAGNOSIS — N401 Enlarged prostate with lower urinary tract symptoms: Secondary | ICD-10-CM | POA: Diagnosis not present

## 2013-06-23 DIAGNOSIS — N281 Cyst of kidney, acquired: Secondary | ICD-10-CM | POA: Diagnosis not present

## 2013-06-28 DIAGNOSIS — H903 Sensorineural hearing loss, bilateral: Secondary | ICD-10-CM | POA: Diagnosis not present

## 2013-07-09 DIAGNOSIS — H903 Sensorineural hearing loss, bilateral: Secondary | ICD-10-CM | POA: Diagnosis not present

## 2013-10-05 DIAGNOSIS — I252 Old myocardial infarction: Secondary | ICD-10-CM | POA: Diagnosis not present

## 2013-10-05 DIAGNOSIS — I2510 Atherosclerotic heart disease of native coronary artery without angina pectoris: Secondary | ICD-10-CM | POA: Diagnosis not present

## 2013-10-05 DIAGNOSIS — Z951 Presence of aortocoronary bypass graft: Secondary | ICD-10-CM | POA: Diagnosis not present

## 2013-10-05 DIAGNOSIS — I059 Rheumatic mitral valve disease, unspecified: Secondary | ICD-10-CM | POA: Diagnosis not present

## 2013-10-05 DIAGNOSIS — I4891 Unspecified atrial fibrillation: Secondary | ICD-10-CM | POA: Diagnosis not present

## 2013-12-09 ENCOUNTER — Ambulatory Visit (INDEPENDENT_AMBULATORY_CARE_PROVIDER_SITE_OTHER): Payer: Medicare Other | Admitting: Internal Medicine

## 2013-12-09 ENCOUNTER — Encounter: Payer: Self-pay | Admitting: Internal Medicine

## 2013-12-09 VITALS — BP 130/72 | HR 59 | Temp 97.8°F | Wt 176.1 lb

## 2013-12-09 DIAGNOSIS — Z23 Encounter for immunization: Secondary | ICD-10-CM | POA: Diagnosis not present

## 2013-12-09 DIAGNOSIS — E785 Hyperlipidemia, unspecified: Secondary | ICD-10-CM

## 2013-12-09 DIAGNOSIS — I2510 Atherosclerotic heart disease of native coronary artery without angina pectoris: Secondary | ICD-10-CM

## 2013-12-09 DIAGNOSIS — I25810 Atherosclerosis of coronary artery bypass graft(s) without angina pectoris: Secondary | ICD-10-CM | POA: Diagnosis not present

## 2013-12-09 DIAGNOSIS — N19 Unspecified kidney failure: Secondary | ICD-10-CM | POA: Diagnosis not present

## 2013-12-09 DIAGNOSIS — I779 Disorder of arteries and arterioles, unspecified: Secondary | ICD-10-CM

## 2013-12-09 LAB — BASIC METABOLIC PANEL
BUN: 26 mg/dL — AB (ref 6–23)
CO2: 24 mEq/L (ref 19–32)
Calcium: 9.6 mg/dL (ref 8.4–10.5)
Chloride: 111 mEq/L (ref 96–112)
Creatinine, Ser: 1.6 mg/dL — ABNORMAL HIGH (ref 0.4–1.5)
GFR: 44.76 mL/min — AB (ref >60.00)
Glucose, Bld: 96 mg/dL (ref 70–99)
Potassium: 4.5 mEq/L (ref 3.5–5.1)
Sodium: 142 mEq/L (ref 135–145)

## 2013-12-09 NOTE — Progress Notes (Signed)
Pre visit review using our clinic review tool, if applicable. No additional management support is needed unless otherwise documented below in the visit note. 

## 2013-12-09 NOTE — Assessment & Plan Note (Addendum)
Ultrasound 05/2013 show atherosclerotic disease of the aorta,   they recommended ultrasound followup 2020

## 2013-12-09 NOTE — Assessment & Plan Note (Signed)
Asymptomatic, last visit with cardiology 09-2013, next visit one-year

## 2013-12-09 NOTE — Assessment & Plan Note (Addendum)
Check a BMP, last nephrology OV 2014 Renal ultrasound 05/2013 showed no obstruction Labs

## 2013-12-09 NOTE — Addendum Note (Signed)
Addended by: Verdie Shire on: 12/09/2013 04:57 PM   Modules accepted: Orders

## 2013-12-09 NOTE — Progress Notes (Signed)
Subjective:    Patient ID: Adam Buchanan., male    DOB: [birth year], 79 y.o.   MRN: [id number]  DOS:  12/09/2013 Type of visit - description : rov Interval history: Routine office visit, in general feeling well. Medication list reviewed, good compliance, no apparent side effects. Labs reviewed, due for a BMP. Visit from cardiology reviewed, he was felt to be stable, next visit 09-2014. The patient request a flu shot   ROS Denies chest pain or difficulty breathing No  nausea, vomiting, diarrhea. Very rarely sees some blood from the hemorrhoids. He remains very active physically.  Past Medical History  Diagnosis Date  . CAD (coronary artery disease) 08/09/2011    CAD, MI 07-2010, stents, then CABG 9-12  Cards -- f/u in [place]    . ELEVATED BLOOD PRESSURE WITHOUT DIAGNOSIS OF HYPERTENSION 05/19/2009    Qualifier: Diagnosis of  By: Drue Novel MD, Jose E.   . Hyperlipidemia   . BPH (benign prostatic hyperplasia)     Past Surgical History  Procedure Laterality Date  . Coronary artery bypass graft      (x5) 12-10-2010  . Tonsilectomy, adenoidectomy, bilateral myringotomy and tubes    . Facial fracture surgery      History   Social History  . Marital Status: Married    Spouse Name: N/A    Number of Children: 2  . Years of Education: N/A   Occupational History  . retired    Social History Main Topics  . Smoking status: Former Games developer  . Smokeless tobacco: Not on file     Comment: quit 1990s  . Alcohol Use: Yes     Comment: socially  . Drug Use: Not on file  . Sexual Activity: Not on file   Other Topics Concern  . Not on file   Social History Narrative  . No narrative on file        Medication List       This list is accurate as of: 12/09/13  4:40 PM.  Always use your most recent med list.               amiodarone 200 MG tablet  Commonly known as:  PACERONE  Take 200 mg by mouth daily.     aspirin 81 MG tablet  Take 81 mg by mouth daily.     atorvastatin 80  MG tablet  Commonly known as:  LIPITOR  Take 80 mg by mouth daily.     lisinopril 10 MG tablet  Commonly known as:  PRINIVIL,ZESTRIL  Take 10 mg by mouth daily.     multivitamin tablet  Take 1 tablet by mouth daily.     nitroGLYCERIN 0.4 MG SL tablet  Commonly known as:  NITROSTAT  Place 0.4 mg under the tongue every 5 (five) minutes as needed.           Objective:   Physical Exam BP 130/72  Pulse 59  Temp(Src) 97.8 F (36.6 C) (Oral)  Wt 176 lb 2 oz (79.89 kg)  SpO2 97% General -- alert, well-developed, NAD.   Lungs -- normal respiratory effort, no intercostal retractions, no accessory muscle use, and normal breath sounds.  Heart-- normal rate, regular rhythm, no murmur.  Extremities-- no pretibial edema bilaterally  Neurologic--  alert & oriented X3. Speech normal, gait appropriate for age, strength symmetric and appropriate for age.  Psych-- Cognition and judgment appear intact. Cooperative with normal attention span and concentration. No anxious or depressed appearing.  Assessment & Plan:

## 2013-12-09 NOTE — Assessment & Plan Note (Addendum)
Last LDL 77, very close to his goal of 70. Plan: Continue present care, watch diet

## 2013-12-09 NOTE — Patient Instructions (Signed)
Get your blood work before you leave     Please come back to the office by 05-2014 for a physical exam. Come back fasting    Stop by the front desk and schedule the visit     

## 2013-12-22 ENCOUNTER — Telehealth: Payer: Self-pay | Admitting: Internal Medicine

## 2013-12-22 NOTE — Telephone Encounter (Signed)
Spoke with Pt, his last PSA was drawn in March 2015, his PSA was 12.11.

## 2013-12-22 NOTE — Telephone Encounter (Signed)
Caller name: Molly MaduroRobert Relation to pt: self Call back number: [phone] Pharmacy:  Reason for call:   Patient is requesting last PSA count

## 2014-03-03 DIAGNOSIS — H02835 Dermatochalasis of left lower eyelid: Secondary | ICD-10-CM | POA: Diagnosis not present

## 2014-03-03 DIAGNOSIS — Z87891 Personal history of nicotine dependence: Secondary | ICD-10-CM | POA: Diagnosis not present

## 2014-03-03 DIAGNOSIS — H02831 Dermatochalasis of right upper eyelid: Secondary | ICD-10-CM | POA: Diagnosis not present

## 2014-03-03 DIAGNOSIS — T1501XA Foreign body in cornea, right eye, initial encounter: Secondary | ICD-10-CM | POA: Diagnosis not present

## 2014-03-03 DIAGNOSIS — H524 Presbyopia: Secondary | ICD-10-CM | POA: Diagnosis not present

## 2014-03-03 DIAGNOSIS — H02834 Dermatochalasis of left upper eyelid: Secondary | ICD-10-CM | POA: Diagnosis not present

## 2014-03-03 DIAGNOSIS — H11823 Conjunctivochalasis, bilateral: Secondary | ICD-10-CM | POA: Diagnosis not present

## 2014-03-03 DIAGNOSIS — H02832 Dermatochalasis of right lower eyelid: Secondary | ICD-10-CM | POA: Diagnosis not present

## 2014-03-03 DIAGNOSIS — H43393 Other vitreous opacities, bilateral: Secondary | ICD-10-CM | POA: Diagnosis not present

## 2014-03-03 DIAGNOSIS — I10 Essential (primary) hypertension: Secondary | ICD-10-CM | POA: Diagnosis not present

## 2014-03-03 DIAGNOSIS — H25813 Combined forms of age-related cataract, bilateral: Secondary | ICD-10-CM | POA: Diagnosis not present

## 2014-03-03 DIAGNOSIS — H52203 Unspecified astigmatism, bilateral: Secondary | ICD-10-CM | POA: Diagnosis not present

## 2014-03-03 DIAGNOSIS — E780 Pure hypercholesterolemia: Secondary | ICD-10-CM | POA: Diagnosis not present

## 2014-03-03 DIAGNOSIS — H5203 Hypermetropia, bilateral: Secondary | ICD-10-CM | POA: Diagnosis not present

## 2014-05-11 ENCOUNTER — Encounter: Payer: Self-pay | Admitting: Internal Medicine

## 2015-01-13 ENCOUNTER — Ambulatory Visit: Payer: PRIVATE HEALTH INSURANCE | Admitting: Internal Medicine

## 2015-01-17 ENCOUNTER — Ambulatory Visit (INDEPENDENT_AMBULATORY_CARE_PROVIDER_SITE_OTHER): Payer: Medicare Other | Admitting: Internal Medicine

## 2015-01-17 ENCOUNTER — Encounter: Payer: Self-pay | Admitting: Internal Medicine

## 2015-01-17 VITALS — BP 124/64 | HR 56 | Temp 97.4°F | Ht 72.0 in | Wt 180.5 lb

## 2015-01-17 DIAGNOSIS — Z23 Encounter for immunization: Secondary | ICD-10-CM | POA: Diagnosis not present

## 2015-01-17 DIAGNOSIS — Z79899 Other long term (current) drug therapy: Secondary | ICD-10-CM | POA: Diagnosis not present

## 2015-01-17 DIAGNOSIS — R972 Elevated prostate specific antigen [PSA]: Secondary | ICD-10-CM

## 2015-01-17 DIAGNOSIS — N400 Enlarged prostate without lower urinary tract symptoms: Secondary | ICD-10-CM | POA: Diagnosis not present

## 2015-01-17 DIAGNOSIS — I25709 Atherosclerosis of coronary artery bypass graft(s), unspecified, with unspecified angina pectoris: Secondary | ICD-10-CM | POA: Diagnosis not present

## 2015-01-17 DIAGNOSIS — E785 Hyperlipidemia, unspecified: Secondary | ICD-10-CM

## 2015-01-17 DIAGNOSIS — I10 Essential (primary) hypertension: Secondary | ICD-10-CM | POA: Diagnosis not present

## 2015-01-17 DIAGNOSIS — Z09 Encounter for follow-up examination after completed treatment for conditions other than malignant neoplasm: Secondary | ICD-10-CM

## 2015-01-17 NOTE — Progress Notes (Signed)
Subjective:    Patient ID: Adam Buchanan., male    DOB: 01/27/1935, 80 y.o.   MRN: [id number]  DOS:  01/17/2015 Type of visit - description : Routine visit. Last visit 11-2013 Interval history: CAD: Good compliance of medication, essentially no symptoms. Saw cardiology July 2015, felt to be stable High cholesterol: On Lipitor, no apparent side effects Elevated PSA: Last note from urology reviewed. He remains asymptomatic.  Review of Systems Denies chest pain or difficulty breathing. Not using nitroglycerin. No lower extremity edema No nausea, vomiting, diarrhea No dysuria, gross motor or difficulty urinating.   Past Medical History  Diagnosis Date  . CAD (coronary artery disease) 08/09/2011    CAD, MI 07-2010, stents, then CABG 9-12  Cards -- f/u in [place]    . ELEVATED BLOOD PRESSURE WITHOUT DIAGNOSIS OF HYPERTENSION 05/19/2009    Qualifier: Diagnosis of  By: Drue Novel MD, Jose E.   . Hyperlipidemia   . BPH (benign prostatic hyperplasia)     Past Surgical History  Procedure Laterality Date  . Coronary artery bypass graft      (x5) 12-10-2010  . Tonsilectomy, adenoidectomy, bilateral myringotomy and tubes    . Facial fracture surgery      Social History   Social History  . Marital Status: Married    Spouse Name: N/A  . Number of Children: 2  . Years of Education: N/A   Occupational History  . retired    Social History Main Topics  . Smoking status: Former Games developer  . Smokeless tobacco: Not on file     Comment: quit 1990s  . Alcohol Use: Yes     Comment: socially  . Drug Use: Not on file  . Sexual Activity: Not on file   Other Topics Concern  . Not on file   Social History Narrative   Married , household-- pt, wife, disabled son (DM-amputee)         Medication List       This list is accurate as of: 01/17/15 11:59 PM.  Always use your most recent med list.               amiodarone 200 MG tablet  Commonly known as:  PACERONE  Take 200 mg by mouth daily.       aspirin 81 MG tablet  Take 81 mg by mouth daily.     atorvastatin 80 MG tablet  Commonly known as:  LIPITOR  Take 80 mg by mouth daily.     lisinopril 10 MG tablet  Commonly known as:  PRINIVIL,ZESTRIL  Take 10 mg by mouth daily.     multivitamin tablet  Take 1 tablet by mouth daily.     nitroGLYCERIN 0.4 MG SL tablet  Commonly known as:  NITROSTAT  Place 0.4 mg under the tongue every 5 (five) minutes as needed.           Objective:   Physical Exam BP 124/64 mmHg  Pulse 56  Temp(Src) 97.4 F (36.3 C) (Oral)  Ht 6' (1.829 m)  Wt 180 lb 8 oz (81.874 kg)  BMI 24.47 kg/m2  SpO2 96% General:   Well developed, well nourished . NAD.  HEENT:  Normocephalic . Face symmetric, atraumatic Lungs:  CTA B Normal respiratory effort, no intercostal retractions, no accessory muscle use. Heart: RRR,  no murmur.  no pretibial edema bilaterally  Abdomen:  Not distended, soft, non-tender. No rebound or rigidity. No mass,organomegaly Skin: Not pale. Not jaundice Neurologic:  alert &  oriented X3.  Speech normal, gait appropriate for age and unassisted Psych--  Cognition and judgment appear intact.  Cooperative with normal attention span and concentration.  Behavior appropriate. No anxious or depressed appearing.    Assessment & Plan:   Assessment > HTN Hyperlipidemia CRI  creat ~ 1.6,  us renal 2015 no obstruction  CV: Cardiology @ [place] CAD: MI, stents 07-2010. CABG 11-2010. Atrial fib -- cardioversion 2012, on amiodarone  GU: --BPH --Elevated PSA reportedly had a bx Dr Vonita MossPeterson 2012 , 05-2013 PSA 12.11, saw urology, rx to RTC: 6 months  Dilated abd aorta per us  05-2013, repeat ultrasound 5 years  Plan: Hyperlipidemia: On Lipitor, no apparent side effects, check labs CRI: Check a BMP CAD, atrial fibrillation: He seems to be doing very well, recommend to see cardiology as he is taking amiodarone Elevated PSA: Saw Dr. Mena GoesEskridge 05-2013, was recommended to come back  in 6 months. Will refer back to him. Check a PSA primary care: Flu shot, Prevnar RTC 6-8 months, CPX

## 2015-01-17 NOTE — Patient Instructions (Addendum)
Get your blood work before you leave     Next visit  for a   complete physical exam in 6 to months. No fasting. Compliance with Lipitor    Please schedule an appointment at the front desk

## 2015-01-17 NOTE — Progress Notes (Signed)
Pre visit review using our clinic review tool, if applicable. No additional management support is needed unless otherwise documented below in the visit note. 

## 2015-01-18 DIAGNOSIS — Z09 Encounter for follow-up examination after completed treatment for conditions other than malignant neoplasm: Secondary | ICD-10-CM | POA: Insufficient documentation

## 2015-01-18 LAB — COMPREHENSIVE METABOLIC PANEL
ALT: 21 U/L (ref 0–53)
AST: 21 U/L (ref 0–37)
Albumin: 4.1 g/dL (ref 3.5–5.2)
Alkaline Phosphatase: 61 U/L (ref 39–117)
BUN: 25 mg/dL — AB (ref 6–23)
CHLORIDE: 109 meq/L (ref 96–112)
CO2: 25 meq/L (ref 19–32)
CREATININE: 1.62 mg/dL — AB (ref 0.40–1.50)
Calcium: 10.2 mg/dL (ref 8.4–10.5)
GFR: 43.99 mL/min — ABNORMAL LOW (ref >60.00)
Glucose, Bld: 81 mg/dL (ref 70–99)
Potassium: 5.1 mEq/L (ref 3.5–5.1)
SODIUM: 143 meq/L (ref 135–145)
Total Bilirubin: 0.7 mg/dL (ref 0.2–1.2)
Total Protein: 6.9 g/dL (ref 6.0–8.3)

## 2015-01-18 LAB — CBC WITH DIFFERENTIAL/PLATELET
BASOS ABS: 0 10*3/uL (ref 0.0–0.1)
Basophils Relative: 0.5 % (ref 0.0–3.0)
EOS PCT: 1.7 % (ref 0.0–5.0)
Eosinophils Absolute: 0.1 10*3/uL (ref 0.0–0.7)
HCT: 43.8 % (ref 39.0–52.0)
HEMOGLOBIN: 14.5 g/dL (ref 13.0–17.0)
LYMPHS ABS: 1.6 10*3/uL (ref 0.7–4.0)
LYMPHS PCT: 25.5 % (ref 12.0–46.0)
MCHC: 33.2 g/dL (ref 30.0–36.0)
MCV: 91.6 fl (ref 78.0–100.0)
MONOS PCT: 7.5 % (ref 3.0–12.0)
Monocytes Absolute: 0.5 10*3/uL (ref 0.1–1.0)
NEUTROS PCT: 64.8 % (ref 43.0–77.0)
Neutro Abs: 4 10*3/uL (ref 1.4–7.7)
Platelets: 200 10*3/uL (ref 150.0–400.0)
RBC: 4.79 Mil/uL (ref 4.22–5.81)
RDW: 14.6 % (ref 11.5–15.5)
WBC: 6.2 10*3/uL (ref 4.0–10.5)

## 2015-01-18 LAB — LIPID PANEL
CHOL/HDL RATIO: 3
Cholesterol: 125 mg/dL (ref 0–200)
HDL: 43.2 mg/dL (ref >39.00)
LDL CALC: 64 mg/dL (ref 0–99)
NonHDL: 81.73
Triglycerides: 87 mg/dL (ref 0.0–149.0)
VLDL: 17.4 mg/dL (ref 0.0–40.0)

## 2015-01-18 LAB — TSH: TSH: 1.79 u[IU]/mL (ref 0.35–4.50)

## 2015-01-18 LAB — PSA: PSA: 12.13 ng/mL — ABNORMAL HIGH (ref 0.10–4.00)

## 2015-01-18 NOTE — Assessment & Plan Note (Signed)
Hyperlipidemia: On Lipitor, no apparent side effects, check labs CRI: Check a BMP CAD, atrial fibrillation: He seems to be doing very well, recommend to see cardiology as he is taking amiodarone Elevated PSA: Saw Dr. Mena GoesEskridge 05-2013, was recommended to come back in 6 months. Will refer back to him. Check a PSA primary care: Flu shot, Prevnar RTC 6-8 months, CPX

## 2015-01-25 ENCOUNTER — Encounter: Payer: Self-pay | Admitting: Internal Medicine

## 2015-02-07 DIAGNOSIS — N400 Enlarged prostate without lower urinary tract symptoms: Secondary | ICD-10-CM | POA: Diagnosis not present

## 2015-02-07 DIAGNOSIS — N5201 Erectile dysfunction due to arterial insufficiency: Secondary | ICD-10-CM | POA: Diagnosis not present

## 2015-02-17 DIAGNOSIS — I480 Paroxysmal atrial fibrillation: Secondary | ICD-10-CM | POA: Insufficient documentation

## 2015-02-17 DIAGNOSIS — I252 Old myocardial infarction: Secondary | ICD-10-CM | POA: Insufficient documentation

## 2015-02-17 DIAGNOSIS — I340 Nonrheumatic mitral (valve) insufficiency: Secondary | ICD-10-CM | POA: Insufficient documentation

## 2015-02-21 DIAGNOSIS — E7800 Pure hypercholesterolemia, unspecified: Secondary | ICD-10-CM | POA: Diagnosis not present

## 2015-02-21 DIAGNOSIS — I252 Old myocardial infarction: Secondary | ICD-10-CM | POA: Diagnosis not present

## 2015-02-21 DIAGNOSIS — I480 Paroxysmal atrial fibrillation: Secondary | ICD-10-CM | POA: Diagnosis not present

## 2015-02-21 DIAGNOSIS — I25810 Atherosclerosis of coronary artery bypass graft(s) without angina pectoris: Secondary | ICD-10-CM | POA: Diagnosis not present

## 2015-02-21 DIAGNOSIS — I340 Nonrheumatic mitral (valve) insufficiency: Secondary | ICD-10-CM | POA: Diagnosis not present

## 2015-02-21 DIAGNOSIS — Z951 Presence of aortocoronary bypass graft: Secondary | ICD-10-CM | POA: Diagnosis not present

## 2015-02-22 ENCOUNTER — Telehealth: Payer: Self-pay | Admitting: *Deleted

## 2015-02-22 NOTE — Telephone Encounter (Signed)
Pt signed ROI received for most recent labs. Labs printed and faxed to Dr[facility] Reuel Boomaniel at [facility] successfully at [phone]. Release sent for scanning. JG//CMA

## 2015-02-23 DIAGNOSIS — I4891 Unspecified atrial fibrillation: Secondary | ICD-10-CM

## 2015-02-23 HISTORY — DX: Unspecified atrial fibrillation: I48.91

## 2015-03-15 DIAGNOSIS — I480 Paroxysmal atrial fibrillation: Secondary | ICD-10-CM | POA: Diagnosis not present

## 2015-03-16 DIAGNOSIS — I480 Paroxysmal atrial fibrillation: Secondary | ICD-10-CM | POA: Diagnosis not present

## 2015-11-21 ENCOUNTER — Encounter: Payer: Self-pay | Admitting: Internal Medicine

## 2015-11-21 ENCOUNTER — Ambulatory Visit (INDEPENDENT_AMBULATORY_CARE_PROVIDER_SITE_OTHER): Payer: Medicare Other | Admitting: Internal Medicine

## 2015-11-21 VITALS — BP 112/68 | HR 63 | Temp 98.3°F | Resp 14 | Ht 72.0 in | Wt 177.4 lb

## 2015-11-21 DIAGNOSIS — E785 Hyperlipidemia, unspecified: Secondary | ICD-10-CM

## 2015-11-21 DIAGNOSIS — Z23 Encounter for immunization: Secondary | ICD-10-CM

## 2015-11-21 DIAGNOSIS — Z136 Encounter for screening for cardiovascular disorders: Secondary | ICD-10-CM

## 2015-11-21 DIAGNOSIS — I10 Essential (primary) hypertension: Secondary | ICD-10-CM | POA: Diagnosis not present

## 2015-11-21 DIAGNOSIS — R0989 Other specified symptoms and signs involving the circulatory and respiratory systems: Secondary | ICD-10-CM

## 2015-11-21 DIAGNOSIS — I77819 Aortic ectasia, unspecified site: Secondary | ICD-10-CM

## 2015-11-21 DIAGNOSIS — Z0000 Encounter for general adult medical examination without abnormal findings: Secondary | ICD-10-CM | POA: Diagnosis not present

## 2015-11-21 DIAGNOSIS — I25810 Atherosclerosis of coronary artery bypass graft(s) without angina pectoris: Secondary | ICD-10-CM | POA: Diagnosis not present

## 2015-11-21 LAB — COMPREHENSIVE METABOLIC PANEL
ALT: 16 U/L (ref 0–53)
AST: 17 U/L (ref 0–37)
Albumin: 4 g/dL (ref 3.5–5.2)
Alkaline Phosphatase: 74 U/L (ref 39–117)
BUN: 28 mg/dL — ABNORMAL HIGH (ref 6–23)
CALCIUM: 9.8 mg/dL (ref 8.4–10.5)
CHLORIDE: 107 meq/L (ref 96–112)
CO2: 28 meq/L (ref 19–32)
CREATININE: 1.51 mg/dL — AB (ref 0.40–1.50)
GFR: 47.61 mL/min — ABNORMAL LOW (ref >60.00)
GLUCOSE: 85 mg/dL (ref 70–99)
Potassium: 5 mEq/L (ref 3.5–5.1)
SODIUM: 140 meq/L (ref 135–145)
Total Bilirubin: 0.7 mg/dL (ref 0.2–1.2)
Total Protein: 7.3 g/dL (ref 6.0–8.3)

## 2015-11-21 LAB — CBC WITH DIFFERENTIAL/PLATELET
BASOS ABS: 0 10*3/uL (ref 0.0–0.1)
Basophils Relative: 0.4 % (ref 0.0–3.0)
EOS ABS: 0.1 10*3/uL (ref 0.0–0.7)
Eosinophils Relative: 1.4 % (ref 0.0–5.0)
HEMATOCRIT: 41.5 % (ref 39.0–52.0)
HEMOGLOBIN: 14.2 g/dL (ref 13.0–17.0)
LYMPHS PCT: 24.3 % (ref 12.0–46.0)
Lymphs Abs: 1.8 10*3/uL (ref 0.7–4.0)
MCHC: 34.2 g/dL (ref 30.0–36.0)
MCV: 88.5 fl (ref 78.0–100.0)
MONOS PCT: 8.6 % (ref 3.0–12.0)
Monocytes Absolute: 0.6 10*3/uL (ref 0.1–1.0)
Neutro Abs: 4.8 10*3/uL (ref 1.4–7.7)
Neutrophils Relative %: 65.3 % (ref 43.0–77.0)
Platelets: 247 10*3/uL (ref 150.0–400.0)
RBC: 4.69 Mil/uL (ref 4.22–5.81)
RDW: 14.5 % (ref 11.5–15.5)
WBC: 7.3 10*3/uL (ref 4.0–10.5)

## 2015-11-21 LAB — LIPID PANEL
CHOL/HDL RATIO: 2
Cholesterol: 93 mg/dL (ref 0–200)
HDL: 39.9 mg/dL (ref >39.00)
LDL CALC: 42 mg/dL (ref 0–99)
NonHDL: 53.21
TRIGLYCERIDES: 56 mg/dL (ref 0.0–149.0)
VLDL: 11.2 mg/dL (ref 0.0–40.0)

## 2015-11-21 NOTE — Progress Notes (Signed)
Pre visit review using our clinic review tool, if applicable. No additional management support is needed unless otherwise documented below in the visit note. 

## 2015-11-21 NOTE — Progress Notes (Signed)
Subjective:    Patient ID: Adam Buchanan., male    DOB: May 03, 1934, 81 y.o.   MRN: [id number]  DOS:  11/21/2015 Type of visit - description :  Here for Medicare AWV:  1. Risk factors based on Past M, S, F history: reviewed 2. Physical Activities:  Very active, has 7 acres of land, no routine exercise 3. Depression/mood: neg screening  4. Hearing:  Has hearing aids  5. ADL's: independent, drives  6. Fall Risk: had a fall ~ 2 weeks, no major injury,prevention discussed , see AVS 7. home Safety: does feel safe at home  8. Height, weight, & visual acuity: see VS, sees eye doctor regulalrly 9. Counseling: provided 10. Labs ordered based on risk factors: if needed  11. Referral Coordination: if needed 12. Care Plan, see assessment and plan , written personalized plan provided , see AVS 13. Cognitive Assessment: motor skills and cognition appropriate for age 37. Care team updated today  15. End-of-life care discussed, rec a HC POA  In addition, today we discussed the following: CAD: Good medication compliance, no recent chest pain. High cholesterol: On Lipitor, no apparent side effects HTN: Good medication compliance, ambulatory BPs within normal.   Review of Systems Constitutional: No fever. No chills. No unexplained wt changes. No unusual sweats  HEENT: No dental problems, no ear discharge, no facial swelling, no voice changes. No eye discharge, no eye  redness , no  intolerance to light   Respiratory: No wheezing , no  difficulty breathing. No cough , no mucus production  Cardiovascular: No CP, no leg swelling , no  Palpitations  GI: no nausea, no vomiting, no diarrhea , no  abdominal pain.  No blood in the stools. No dysphagia, no odynophagia    Endocrine: No polyphagia, no polyuria , no polydipsia  GU: No dysuria, gross hematuria, difficulty urinating. No urinary urgency, no frequency.  Musculoskeletal: No joint swellings or unusual aches or pains  Skin: No change  in the color of the skin, palor , no  Rash  Allergic, immunologic: No environmental allergies , no  food allergies  Neurological: No dizziness no  syncope. No headaches. No diplopia, no slurred, no slurred speech, no motor deficits, no facial  Numbness  Hematological: No enlarged lymph nodes, no easy bruising , no unusual bleedings  Psychiatry: No suicidal ideas, no hallucinations, no beavior problems, no confusion.  No unusual/severe anxiety, no depression   Past Medical History:  Diagnosis Date  . BPH (benign prostatic hyperplasia)   . CAD (coronary artery disease) 08/09/2011   CAD, MI 07-2010, stents, then CABG 9-12  Cards -- f/u in [place]    . ELEVATED BLOOD PRESSURE WITHOUT DIAGNOSIS OF HYPERTENSION 05/19/2009   Qualifier: Diagnosis of  By: Drue Novel MD, Nolon Rod Hyperlipidemia     Past Surgical History:  Procedure Laterality Date  . CORONARY ARTERY BYPASS GRAFT     (x5) 12-10-2010  . FACIAL FRACTURE SURGERY    . TONSILECTOMY, ADENOIDECTOMY, BILATERAL MYRINGOTOMY AND TUBES      Social History   Social History  . Marital status: Married    Spouse name: N/A  . Number of children: 2  . Years of education: N/A   Occupational History  . retired    Social History Main Topics  . Smoking status: Former Games developer  . Smokeless tobacco: Never Used     Comment: quit 1990s  . Alcohol use Yes     Comment: socially  . Drug use:  No  . Sexual activity: Not on file   Other Topics Concern  . Not on file   Social History Narrative   Married , household-- pt, wife, disabled son (DM-amputee)      Family History  Problem Relation Age of Onset  . Hypertension Father   . Hypertension Maternal Grandmother   . Colon cancer Neg Hx   . Prostate cancer Neg Hx         Medication List       Accurate as of 11/21/15 11:59 PM. Always use your most recent med list.          aspirin 81 MG tablet Take 81 mg by mouth daily.   atorvastatin 80 MG tablet Commonly known as:  LIPITOR Take  80 mg by mouth daily.   lisinopril 10 MG tablet Commonly known as:  PRINIVIL,ZESTRIL Take 10 mg by mouth daily.   metoprolol succinate 25 MG 24 hr tablet Commonly known as:  TOPROL-XL Take 25 mg by mouth daily.   multivitamin tablet Take 1 tablet by mouth daily.   nitroGLYCERIN 0.4 MG SL tablet Commonly known as:  NITROSTAT Place 0.4 mg under the tongue every 5 (five) minutes as needed.          Objective:   Physical Exam BP 112/68 (BP Location: Left Arm, Patient Position: Sitting, Cuff Size: Normal)   Pulse 63   Temp 98.3 F (36.8 C) (Oral)   Resp 14   Ht 6' (1.829 m)   Wt 177 lb 6 oz (80.5 kg)   SpO2 99%   BMI 24.06 kg/m   General:   Well developed, well nourished . NAD.  Neck: No  thyromegaly . Normal carotid pulses, no bruit HEENT:  Normocephalic . Face symmetric, atraumatic Lungs:  CTA B Normal respiratory effort, no intercostal retractions, no accessory muscle use. Heart: RRR,  no murmur.  No pretibial edema bilaterally  Abdomen:  Not distended, soft, non-tender. No rebound or rigidity.  No bruit, barely palpable aorta upper abdomen. Skin: Exposed areas without rash. Not pale. Not jaundice Neurologic:  alert & oriented X3.  Speech normal, gait appropriate for age and unassisted Strength symmetric and appropriate for age.  Psych: Cognition and judgment appear intact.  Cooperative with normal attention span and concentration.  Behavior appropriate. No anxious or depressed appearing.    Assessment & Plan:   Assessment > HTN Hyperlipidemia CRI  creat ~ 1.6,  us renal 2015 no obstruction  CV: Cardiology @ [place] CAD: MI, stents 07-2010. CABG 11-2010. Atrial fib -- cardioversion 2012, on amiodarone  GU: --BPH --Elevated PSA reportedly had a bx Dr Vonita MossPeterson 2012 , 05-2013 PSA 12.11, saw urology, rx to RTC: 6 months  Dilated abd aorta per us  05-2013, repeat ultrasound 5 years  Plan: HTN: Continue lisinopril, Toprol. Check a CMP,  CBC Hyperlipidemia: Continue Lipitor, check FLP CAD: Continue aspirin, beta blockers, statins. asx Atrial fibrillation: Currently on aspirin, amiodarone was discontinued several months ago, asx. RTC one year

## 2015-11-21 NOTE — Assessment & Plan Note (Addendum)
Td < than 10 years per pt, declined a booster ; PNM shot 2011; Prevnar -2016 ; zostavax-- declined  flu shot -- today colonoscopy 05/10/2004, >>>  hyperplastic polyps. We discussed possibly repeat colonoscopy as suggested by GI, he declined which is reasonable according to guidelines. PSAs per urology Palpable aorta?: Previously aorta was a slightly dilated by US. Repeat an aorta ultrasound. Diet-exercise discussed

## 2015-11-21 NOTE — Patient Instructions (Signed)
Get your blood work before you leave      Next visit in one year    Fall Prevention and Home Safety Falls cause injuries and can affect all age groups. It is possible to use preventive measures to significantly decrease the likelihood of falls. There are many simple measures which can make your home safer and prevent falls. OUTDOORS  Repair cracks and edges of walkways and driveways.  Remove high doorway thresholds.  Trim shrubbery on the main path into your home.  Have good outside lighting.  Clear walkways of tools, rocks, debris, and clutter.  Check that handrails are not broken and are securely fastened. Both sides of steps should have handrails.  Have leaves, snow, and ice cleared regularly.  Use sand or salt on walkways during winter months.  In the garage, clean up grease or oil spills. BATHROOM  Install night lights.  Install grab bars by the toilet and in the tub and shower.  Use non-skid mats or decals in the tub or shower.  Place a plastic non-slip stool in the shower to sit on, if needed.  Keep floors dry and clean up all water on the floor immediately.  Remove soap buildup in the tub or shower on a regular basis.  Secure bath mats with non-slip, double-sided rug tape.  Remove throw rugs and tripping hazards from the floors. BEDROOMS  Install night lights.  Make sure a bedside light is easy to reach.  Do not use oversized bedding.  Keep a telephone by your bedside.  Have a firm chair with side arms to use for getting dressed.  Remove throw rugs and tripping hazards from the floor. KITCHEN  Keep handles on pots and pans turned toward the center of the stove. Use back burners when possible.  Clean up spills quickly and allow time for drying.  Avoid walking on wet floors.  Avoid hot utensils and knives.  Position shelves so they are not too high or low.  Place commonly used objects within easy reach.  If necessary, use a sturdy step  stool with a grab bar when reaching.  Keep electrical cables out of the way.  Do not use floor polish or wax that makes floors slippery. If you must use wax, use non-skid floor wax.  Remove throw rugs and tripping hazards from the floor. STAIRWAYS  Never leave objects on stairs.  Place handrails on both sides of stairways and use them. Fix any loose handrails. Make sure handrails on both sides of the stairways are as long as the stairs.  Check carpeting to make sure it is firmly attached along stairs. Make repairs to worn or loose carpet promptly.  Avoid placing throw rugs at the top or bottom of stairways, or properly secure the rug with carpet tape to prevent slippage. Get rid of throw rugs, if possible.  Have an electrician put in a light switch at the top and bottom of the stairs. OTHER FALL PREVENTION TIPS  Wear low-heel or rubber-soled shoes that are supportive and fit well. Wear closed toe shoes.  When using a stepladder, make sure it is fully opened and both spreaders are firmly locked. Do not climb a closed stepladder.  Add color or contrast paint or tape to grab bars and handrails in your home. Place contrasting color strips on first and last steps.  Learn and use mobility aids as needed. Install an electrical emergency response system.  Turn on lights to avoid dark areas. Replace light bulbs that burn   out immediately. Get light switches that glow.  Arrange furniture to create clear pathways. Keep furniture in the same place.  Firmly attach carpet with non-skid or double-sided tape.  Eliminate uneven floor surfaces.  Select a carpet pattern that does not visually hide the edge of steps.  Be aware of all pets. OTHER HOME SAFETY TIPS  Set the water temperature for 120 F (48.8 C).  Keep emergency numbers on or near the telephone.  Keep smoke detectors on every level of the home and near sleeping areas. Document Released: 03/01/2002 Document Revised: 09/10/2011  Document Reviewed: 05/31/2011 ExitCare Patient Information 2015 [employer]. This information is not intended to replace advice given to you by your health care provider. Make sure you discuss any questions you have with your health care provider.   Preventive Care for Adults Ages 65 and over  Blood pressure check.** / Every 1 to 2 years.  Lipid and cholesterol check.**/ Every 5 years beginning at age 20.  Lung cancer screening. / Every year if you are aged 55-80 years and have a 30-pack-year history of smoking and currently smoke or have quit within the past 15 years. Yearly screening is stopped once you have quit smoking for at least 15 years or develop a health problem that would prevent you from having lung cancer treatment.  Fecal occult blood test (FOBT) of stool. / Every year beginning at age 50 and continuing until age 75. You may not have to do this test if you get a colonoscopy every 10 years.  Flexible sigmoidoscopy** or colonoscopy.** / Every 5 years for a flexible sigmoidoscopy or every 10 years for a colonoscopy beginning at age 50 and continuing until age 75.  Hepatitis C blood test.** / For all people born from 1945 through 1965 and any individual with known risks for hepatitis C.  Abdominal aortic aneurysm (AAA) screening.** / A one-time screening for ages 65 to 75 years who are current or former smokers.  Skin self-exam. / Monthly.  Influenza vaccine. / Every year.  Tetanus, diphtheria, and acellular pertussis (Tdap/Td) vaccine.** / 1 dose of Td every 10 years.  Varicella vaccine.** / Consult your health care provider.  Zoster vaccine.** / 1 dose for adults aged 60 years or older.  Pneumococcal 13-valent conjugate (PCV13) vaccine.** / Consult your health care provider.  Pneumococcal polysaccharide (PPSV23) vaccine.** / 1 dose for all adults aged 65 years and older.  Meningococcal vaccine.** / Consult your health care provider.  Hepatitis A vaccine.** /  Consult your health care provider.  Hepatitis B vaccine.** / Consult your health care provider.  Haemophilus influenzae type b (Hib) vaccine.** / Consult your health care provider. **Family history and personal history of risk and conditions may change your health care provider's recommendations. Document Released: 05/07/2001 Document Revised: 03/16/2013 Document Reviewed: 08/06/2010 ExitCare Patient Information 2015 [employer]. This information is not intended to replace advice given to you by your health care provider. Make sure you discuss any questions you have with your health care provider.   

## 2015-11-22 NOTE — Assessment & Plan Note (Signed)
HTN: Continue lisinopril, Toprol. Check a CMP, CBC Hyperlipidemia: Continue Lipitor, check FLP CAD: Continue aspirin, beta blockers, statins. asx Atrial fibrillation: Currently on aspirin, amiodarone was discontinued several months ago, asx. RTC one year

## 2015-12-07 ENCOUNTER — Encounter (HOSPITAL_COMMUNITY): Payer: PRIVATE HEALTH INSURANCE

## 2015-12-11 ENCOUNTER — Ambulatory Visit (HOSPITAL_COMMUNITY)
Admission: RE | Admit: 2015-12-11 | Discharge: 2015-12-11 | Disposition: A | Discharge status: Home / Self Care | Payer: Medicare Other | Source: Ambulatory Visit | Attending: Internal Medicine | Admitting: Internal Medicine

## 2015-12-11 DIAGNOSIS — R0989 Other specified symptoms and signs involving the circulatory and respiratory systems: Secondary | ICD-10-CM | POA: Insufficient documentation

## 2015-12-11 DIAGNOSIS — I2510 Atherosclerotic heart disease of native coronary artery without angina pectoris: Secondary | ICD-10-CM | POA: Diagnosis not present

## 2015-12-11 DIAGNOSIS — I10 Essential (primary) hypertension: Secondary | ICD-10-CM | POA: Insufficient documentation

## 2015-12-11 DIAGNOSIS — I708 Atherosclerosis of other arteries: Secondary | ICD-10-CM | POA: Diagnosis not present

## 2015-12-11 DIAGNOSIS — Z136 Encounter for screening for cardiovascular disorders: Secondary | ICD-10-CM | POA: Insufficient documentation

## 2015-12-11 DIAGNOSIS — Z951 Presence of aortocoronary bypass graft: Secondary | ICD-10-CM | POA: Insufficient documentation

## 2015-12-11 DIAGNOSIS — I700 Atherosclerosis of aorta: Secondary | ICD-10-CM | POA: Insufficient documentation

## 2015-12-11 DIAGNOSIS — Z87891 Personal history of nicotine dependence: Secondary | ICD-10-CM | POA: Diagnosis not present

## 2015-12-11 DIAGNOSIS — R109 Unspecified abdominal pain: Secondary | ICD-10-CM | POA: Diagnosis not present

## 2015-12-11 DIAGNOSIS — I77819 Aortic ectasia, unspecified site: Secondary | ICD-10-CM | POA: Diagnosis not present

## 2015-12-11 DIAGNOSIS — E785 Hyperlipidemia, unspecified: Secondary | ICD-10-CM | POA: Insufficient documentation

## 2016-01-10 ENCOUNTER — Encounter: Payer: Self-pay | Admitting: Internal Medicine

## 2016-01-10 ENCOUNTER — Ambulatory Visit (HOSPITAL_BASED_OUTPATIENT_CLINIC_OR_DEPARTMENT_OTHER)
Admission: RE | Admit: 2016-01-10 | Discharge: 2016-01-10 | Disposition: A | Discharge status: Home / Self Care | Payer: Medicare Other | Source: Ambulatory Visit | Attending: Internal Medicine | Admitting: Internal Medicine

## 2016-01-10 ENCOUNTER — Ambulatory Visit (INDEPENDENT_AMBULATORY_CARE_PROVIDER_SITE_OTHER): Payer: Medicare Other | Admitting: Internal Medicine

## 2016-01-10 VITALS — BP 118/64 | HR 69 | Temp 98.6°F | Resp 14 | Ht 72.0 in | Wt 170.0 lb

## 2016-01-10 DIAGNOSIS — M810 Age-related osteoporosis without current pathological fracture: Secondary | ICD-10-CM

## 2016-01-10 DIAGNOSIS — Z951 Presence of aortocoronary bypass graft: Secondary | ICD-10-CM | POA: Insufficient documentation

## 2016-01-10 DIAGNOSIS — R059 Cough, unspecified: Secondary | ICD-10-CM

## 2016-01-10 DIAGNOSIS — R05 Cough: Secondary | ICD-10-CM

## 2016-01-10 DIAGNOSIS — R0989 Other specified symptoms and signs involving the circulatory and respiratory systems: Secondary | ICD-10-CM | POA: Diagnosis not present

## 2016-01-10 DIAGNOSIS — I25810 Atherosclerosis of coronary artery bypass graft(s) without angina pectoris: Secondary | ICD-10-CM | POA: Diagnosis not present

## 2016-01-10 DIAGNOSIS — M4854XA Collapsed vertebra, not elsewhere classified, thoracic region, initial encounter for fracture: Secondary | ICD-10-CM | POA: Insufficient documentation

## 2016-01-10 DIAGNOSIS — N5089 Other specified disorders of the male genital organs: Secondary | ICD-10-CM

## 2016-01-10 DIAGNOSIS — Z09 Encounter for follow-up examination after completed treatment for conditions other than malignant neoplasm: Secondary | ICD-10-CM

## 2016-01-10 MED ORDER — AZITHROMYCIN 250 MG PO TABS: ORAL_TABLET | ORAL | 0 refills | Status: DC

## 2016-01-10 NOTE — Patient Instructions (Signed)
Get a chest XR  Rest, fluids , tylenol  For cough:  Take Mucinex DM twice a day as needed until better  For nasal congestion: Use OTC Nasocort or Flonase : 2 nasal sprays on each side of the nose in the morning until you feel better   Avoid decongestants such as  Pseudoephedrine or phenylephrine    Take the antibiotic as prescribed  (zithromax)  Call if not gradually better over the next  10 days  Call anytime if the symptoms are severe

## 2016-01-10 NOTE — Progress Notes (Signed)
Pre visit review using our clinic review tool, if applicable. No additional management support is needed unless otherwise documented below in the visit note. 

## 2016-01-10 NOTE — Progress Notes (Signed)
Subjective:    Patient ID: Denver Faster., male    DOB: 1935-02-03, 81 y.o.   MRN: [id number]  DOS:  01/10/2016 Type of visit - description : Acute visit Interval history: 2 weeks ago got very sick with malaise, sinus and chest congestion, cough, decreased appetite. Had fever and chills the first few days. Overall is better but has persisting cough and chest congestion. Minimal if any sputum production. Fevers are resolved. He also reports 10 pound weight loss over the last 2 weeks.  Wt Readings from Last 3 Encounters:  01/10/16 170 lb (77.1 kg)  11/21/15 177 lb 6 oz (80.5 kg)  01/17/15 180 lb 8 oz (81.9 kg)    Review of Systems He did have a flu shot this year Denies chest pain or difficulty breathing No nausea or vomiting, had some diarrhea.  Past Medical History:  Diagnosis Date  . BPH (benign prostatic hyperplasia)   . CAD (coronary artery disease) 08/09/2011   CAD, MI 07-2010, stents, then CABG 9-12  Cards -- f/u in [place]    . ELEVATED BLOOD PRESSURE WITHOUT DIAGNOSIS OF HYPERTENSION 05/19/2009   Qualifier: Diagnosis of  By: Drue Novel MD, Nolon Rod Hyperlipidemia     Past Surgical History:  Procedure Laterality Date  . CORONARY ARTERY BYPASS GRAFT     (x5) 12-10-2010  . FACIAL FRACTURE SURGERY    . TONSILECTOMY, ADENOIDECTOMY, BILATERAL MYRINGOTOMY AND TUBES      Social History   Social History  . Marital status: Married    Spouse name: N/A  . Number of children: 2  . Years of education: N/A   Occupational History  . retired    Social History Main Topics  . Smoking status: Former Games developer  . Smokeless tobacco: Never Used     Comment: quit 1990s  . Alcohol use Yes     Comment: socially  . Drug use: No  . Sexual activity: Not on file   Other Topics Concern  . Not on file   Social History Narrative   Married , household-- pt, wife, disabled son (DM-amputee)         Medication List       Accurate as of 01/10/16 11:59 PM. Always use your most recent  med list.          aspirin 81 MG tablet Take 81 mg by mouth daily.   atorvastatin 80 MG tablet Commonly known as:  LIPITOR Take 80 mg by mouth daily.   azithromycin 250 MG tablet Commonly known as:  ZITHROMAX Z-PAK 2 tabs a day the first day, then 1 tab a day x 4 days   lisinopril 10 MG tablet Commonly known as:  PRINIVIL,ZESTRIL Take 10 mg by mouth daily.   metoprolol succinate 25 MG 24 hr tablet Commonly known as:  TOPROL-XL Take 25 mg by mouth daily.   multivitamin tablet Take 1 tablet by mouth daily.   nitroGLYCERIN 0.4 MG SL tablet Commonly known as:  NITROSTAT Place 0.4 mg under the tongue every 5 (five) minutes as needed.          Objective:   Physical Exam BP 118/64 (BP Location: Left Arm, Patient Position: Sitting, Cuff Size: Normal)   Pulse 69   Temp 98.6 F (37 C) (Oral)   Resp 14   Ht 6' (1.829 m)   Wt 170 lb (77.1 kg)   SpO2 97%   BMI 23.06 kg/m     General:   Well developed, well  nourished . NAD.  HEENT:  Normocephalic . Face symmetric, atraumatic. TMs normal, throat symmetric and not red. Sinuses no TTP. Lungs:  CTA B Normal respiratory effort, no intercostal retractions, no accessory muscle use. Heart: RRR,  no murmur.  No pretibial edema bilaterally  Skin: Not pale. Not jaundice Neurologic:  alert & oriented X3.  Speech normal, gait appropriate for age and unassisted Psych--  Cognition and judgment appear intact.  Cooperative with normal attention span and concentration.  Behavior appropriate. No anxious or depressed appearing.   Assessment & Plan:   Assessment > HTN Hyperlipidemia CRI  creat ~ 1.6,  us renal 2015 no obstruction  CV:  Cardiology @ [place] CAD: MI, stents 07-2010. CABG 11-2010. Atrial fib -- cardioversion 2012, on amiodarone  GU: --BPH --Elevated PSA reportedly had a bx Dr Vonita MossPeterson 2012 , 05-2013 PSA 12.11, saw urology, rx to RTC: 6 months  Dilated abd aorta per us  05-2013, repeat ultrasound 5  years  Plan: Cough: Persisting cough after what seems to be as severe viral infection (decreased appetite, weight loss, fever). To be sure we will get a chest x-ray but otherwise recommend a Z-Pak and Mucinex. See instructions. Addendum: Chest x-ray show no pneumonia but old vertebral fractures, osteoporosis? Will order a DEXA

## 2016-01-11 ENCOUNTER — Telehealth: Payer: Self-pay | Admitting: Internal Medicine

## 2016-01-11 MED ORDER — AZITHROMYCIN 250 MG PO TABS: ORAL_TABLET | ORAL | 0 refills | Status: DC

## 2016-01-11 NOTE — Telephone Encounter (Signed)
Rx sent to [facility].  

## 2016-01-11 NOTE — Telephone Encounter (Signed)
Pt called in, he says that he was prescribed ZITHROMAX Z-PAK by provider. He says that his pharmacy doesn't have the medication in stock. He would like to know if provider could see if medication is in stock at pharmacy downstairs [facility] if so he says that he will like to pick up from there.    Please assist further.. [phone]

## 2016-01-11 NOTE — Assessment & Plan Note (Addendum)
Cough: Persisting cough after what seems to be as severe viral infection (decreased appetite, weight loss, fever). To be sure we will get a chest x-ray but otherwise recommend a Z-Pak and Mucinex. See instructions. Addendum: Chest x-ray show no pneumonia but old vertebral fractures, osteoporosis? Will order a DEXA

## 2016-01-15 ENCOUNTER — Ambulatory Visit (HOSPITAL_BASED_OUTPATIENT_CLINIC_OR_DEPARTMENT_OTHER)
Admission: RE | Admit: 2016-01-15 | Discharge: 2016-01-15 | Disposition: A | Discharge status: Home / Self Care | Payer: Medicare Other | Source: Ambulatory Visit | Attending: Internal Medicine | Admitting: Internal Medicine

## 2016-01-15 DIAGNOSIS — M85851 Other specified disorders of bone density and structure, right thigh: Secondary | ICD-10-CM | POA: Diagnosis not present

## 2016-01-15 DIAGNOSIS — M810 Age-related osteoporosis without current pathological fracture: Secondary | ICD-10-CM | POA: Diagnosis not present

## 2016-01-15 DIAGNOSIS — M8580 Other specified disorders of bone density and structure, unspecified site: Secondary | ICD-10-CM | POA: Insufficient documentation

## 2016-01-15 DIAGNOSIS — Z87891 Personal history of nicotine dependence: Secondary | ICD-10-CM | POA: Insufficient documentation

## 2016-01-15 DIAGNOSIS — N5089 Other specified disorders of the male genital organs: Secondary | ICD-10-CM | POA: Diagnosis not present

## 2016-02-05 ENCOUNTER — Telehealth: Payer: Self-pay | Admitting: Internal Medicine

## 2016-02-05 NOTE — Telephone Encounter (Signed)
Pt listed as male gender on dexa scan. Unsure who to contact to fix issue.

## 2016-02-05 NOTE — Telephone Encounter (Signed)
Patient's wife Manufacturing systems engineer(margaret) is calling regarding patient's DEXA scan. She states there is incorrect information on the form. Please advise.  Patient phone: [phone]

## 2016-02-05 NOTE — Telephone Encounter (Signed)
Called [place] [facility] at [phone] and spoke with Diane who will put in for an addendum to this patients report.

## 2016-02-28 DIAGNOSIS — Z951 Presence of aortocoronary bypass graft: Secondary | ICD-10-CM | POA: Diagnosis not present

## 2016-02-28 DIAGNOSIS — I252 Old myocardial infarction: Secondary | ICD-10-CM | POA: Diagnosis not present

## 2016-02-28 DIAGNOSIS — I2510 Atherosclerotic heart disease of native coronary artery without angina pectoris: Secondary | ICD-10-CM | POA: Diagnosis not present

## 2016-02-28 DIAGNOSIS — E7800 Pure hypercholesterolemia, unspecified: Secondary | ICD-10-CM | POA: Diagnosis not present

## 2016-02-28 DIAGNOSIS — I480 Paroxysmal atrial fibrillation: Secondary | ICD-10-CM | POA: Diagnosis not present

## 2016-04-01 DIAGNOSIS — N400 Enlarged prostate without lower urinary tract symptoms: Secondary | ICD-10-CM | POA: Diagnosis not present

## 2016-04-01 DIAGNOSIS — R972 Elevated prostate specific antigen [PSA]: Secondary | ICD-10-CM | POA: Diagnosis not present

## 2016-04-01 LAB — PSA: PSA: 12.3

## 2016-04-05 ENCOUNTER — Telehealth: Payer: Self-pay | Admitting: Internal Medicine

## 2016-04-05 NOTE — Telephone Encounter (Signed)
I can do a MMSE , a screening test for dementia however if she desires a formal mental, memory test, we can refer him to one of the psychologists at our neurology department.

## 2016-04-05 NOTE — Telephone Encounter (Signed)
Please advise 

## 2016-04-05 NOTE — Telephone Encounter (Signed)
LMOM informing Pt's wife to return call.

## 2016-04-05 NOTE — Telephone Encounter (Signed)
Caller name:Cleverly,Margaret Relation to AV:[id number]:spouse  Call back number:[phone]   Reason for call:  Spouse states medicare offers cognitive testing for patients who turn 6365 and spouse was inquiring if test has been done, please advise

## 2016-04-12 NOTE — Telephone Encounter (Addendum)
Spouse returning call best # [phone]

## 2016-07-10 ENCOUNTER — Encounter: Payer: Self-pay | Admitting: Internal Medicine

## 2016-07-10 ENCOUNTER — Ambulatory Visit (INDEPENDENT_AMBULATORY_CARE_PROVIDER_SITE_OTHER): Payer: Medicare Other | Admitting: Internal Medicine

## 2016-07-10 VITALS — BP 124/68 | HR 49 | Temp 97.7°F | Resp 14 | Ht 72.0 in | Wt 177.5 lb

## 2016-07-10 DIAGNOSIS — N5082 Scrotal pain: Secondary | ICD-10-CM | POA: Diagnosis not present

## 2016-07-10 DIAGNOSIS — N433 Hydrocele, unspecified: Secondary | ICD-10-CM

## 2016-07-10 DIAGNOSIS — R030 Elevated blood-pressure reading, without diagnosis of hypertension: Secondary | ICD-10-CM | POA: Diagnosis not present

## 2016-07-10 DIAGNOSIS — I10 Essential (primary) hypertension: Secondary | ICD-10-CM

## 2016-07-10 DIAGNOSIS — I25810 Atherosclerosis of coronary artery bypass graft(s) without angina pectoris: Secondary | ICD-10-CM

## 2016-07-10 LAB — BASIC METABOLIC PANEL
BUN: 23 mg/dL (ref 6–23)
CALCIUM: 9.9 mg/dL (ref 8.4–10.5)
CHLORIDE: 109 meq/L (ref 96–112)
CO2: 26 meq/L (ref 19–32)
Creatinine, Ser: 1.44 mg/dL (ref 0.40–1.50)
GFR: 50.2 mL/min — AB (ref >60.00)
GLUCOSE: 91 mg/dL (ref 70–99)
Potassium: 4.3 mEq/L (ref 3.5–5.1)
Sodium: 140 mEq/L (ref 135–145)

## 2016-07-10 NOTE — Progress Notes (Signed)
Subjective:    Patient ID: Adam Buchanan., male    DOB: Jan 04, 1935, 82 y.o.   MRN: [id number]  DOS:  07/10/2016 Type of visit - description : rov Interval history: CAD: Cardiology note reviewed HTN: Good compliance with medication, ambulatory BP 130/80 Bone density tests reviewed: Osteopenia Reports several decades history of left scrotal swelling. Previously seen by other doctors, told that if no symptoms he was okay. Lately, the area seems to be more prominent and he does have some soreness on and off.    Review of Systems Denies chest pain or difficulty breathing. Bilateral lower extremity edema at the end of the day, decreased with elevation No nausea, vomiting, diarrhea No dysuria or gross hematuria  Past Medical History:  Diagnosis Date  . BPH (benign prostatic hyperplasia)   . CAD (coronary artery disease) 08/09/2011   CAD, MI 07-2010, stents, then CABG 9-12  Cards -- f/u in [place]    . ELEVATED BLOOD PRESSURE WITHOUT DIAGNOSIS OF HYPERTENSION 05/19/2009   Qualifier: Diagnosis of  By: Drue Novel MD, Nolon Rod Hyperlipidemia     Past Surgical History:  Procedure Laterality Date  . CORONARY ARTERY BYPASS GRAFT     (x5) 12-10-2010  . FACIAL FRACTURE SURGERY    . TONSILECTOMY, ADENOIDECTOMY, BILATERAL MYRINGOTOMY AND TUBES      Social History   Social History  . Marital status: Married    Spouse name: N/A  . Number of children: 2  . Years of education: N/A   Occupational History  . retired    Social History Main Topics  . Smoking status: Former Games developer  . Smokeless tobacco: Never Used     Comment: quit 1990s  . Alcohol use Yes     Comment: socially  . Drug use: No  . Sexual activity: Not on file   Other Topics Concern  . Not on file   Social History Narrative   Married , household-- pt, wife, disabled son (DM-amputee)       Allergies as of 07/10/2016   No Known Allergies     Medication List       Accurate as of 07/10/16  2:10 PM. Always use your most  recent med list.          aspirin 81 MG tablet Take 81 mg by mouth daily.   atorvastatin 80 MG tablet Commonly known as:  LIPITOR Take 80 mg by mouth daily.   lisinopril 10 MG tablet Commonly known as:  PRINIVIL,ZESTRIL Take 10 mg by mouth daily.   metoprolol succinate 25 MG 24 hr tablet Commonly known as:  TOPROL-XL Take 25 mg by mouth daily.   multivitamin tablet Take 1 tablet by mouth daily.   nitroGLYCERIN 0.4 MG SL tablet Commonly known as:  NITROSTAT Place 0.4 mg under the tongue every 5 (five) minutes as needed.          Objective:   Physical Exam BP 124/68 (BP Location: Left Arm, Patient Position: Sitting, Cuff Size: Normal)   Pulse (!) 49   Temp 97.7 F (36.5 C) (Oral)   Resp 14   Ht 6' (1.829 m)   Wt 177 lb 8 oz (80.5 kg)   SpO2 99%   BMI 24.07 kg/m  General:   Well developed, well nourished . NAD.  HEENT:  Normocephalic . Face symmetric, atraumatic Lungs:  CTA B Normal respiratory effort, no intercostal retractions, no accessory muscle use. Heart: RRR,  no murmur.  No pretibial edema bilaterally  Skin: Not pale. Not jaundice GU: Scrotal sac enlargement L>>R , right testicle identify and seems normal, unable to identify the left testicle. The left side scrotal enlargement is reducible to a point with some discomfort. In addition, seems to have a small left inguinal hernia. Right groin  side is normal. Neurologic:  alert & oriented X3.  Speech normal, gait appropriate for age and unassisted Psych--  Cognition and judgment appear intact.  Cooperative with normal attention span and concentration.  Behavior appropriate. No anxious or depressed appearing.      Assessment & Plan:  Assessment > HTN Hyperlipidemia CRI  creat ~ 1.6,  US renal 2015 no obstruction  CV:  Cardiology @ [place] CAD: MI, stents 07-2010. CABG 11-2010. Atrial fib -- cardioversion 2012, on amiodarone  MR per echo  11-2010, no murmur, no need to repeat echo per  cardiology note GU: --BPH --Elevated PSA reportedly had a bx Dr Vonita Moss 2012 , 05-2013 PSA 12.11, saw urology, rx to RTC: 6 months  Dilated abd aorta per [place]  05-2013, repeat ultrasound 5 years Osteopenia: T score -1.1 (01-2016).  Plan: HTN: Well-controlled, continue lisinopril, Toprol, check a BMP Hyperlipidemia: Continue Lipitor, last AST, ALT an FLP satisfactory. CAD, A. fib: Saw cardiology 02-2016, felt to be stable, LDL target 70. BP was elevated at the time, no changes were made. Left scrotal swelling: Suspect a indirect inguinal  hernia, he also seemed to have direct inguinal hernia. Scrotal swelling  is a chronic problem but lately the area has increased in size and is causing discomfort. Check a ultrasound, depending on results refer to general surgery or urology. Incarceration  sxs  Discussed >> ER . RTC 4 months for ROV, medicare wellness after  [phone]

## 2016-07-10 NOTE — Progress Notes (Signed)
Pre visit review using our clinic review tool, if applicable. No additional management support is needed unless otherwise documented below in the visit note. 

## 2016-07-10 NOTE — Assessment & Plan Note (Signed)
HTN: Well-controlled, continue lisinopril, Toprol, check a BMP Hyperlipidemia: Continue Lipitor, last AST, ALT an FLP satisfactory. CAD, A. fib: Saw cardiology 02-2016, felt to be stable, LDL target 70. BP was elevated at the time, no changes were made. Left scrotal swelling: Suspect a indirect inguinal  hernia, he also seemed to have direct inguinal hernia. Scrotal swelling  is a chronic problem but lately the area has increased in size and is causing discomfort. Check a ultrasound, depending on results refer to general surgery or urology. Incarceration  sxs  Discussed >> ER . RTC 4 months for ROV, medicare wellness after  [phone]

## 2016-07-10 NOTE — Patient Instructions (Signed)
GO TO THE LAB : Get the blood work     GO TO THE FRONT DESK Schedule your next appointment for a  routine visit in 6 months  Schedule a Medicare wellness with one of our RNs after  8- 2017

## 2016-07-11 ENCOUNTER — Ambulatory Visit (HOSPITAL_BASED_OUTPATIENT_CLINIC_OR_DEPARTMENT_OTHER)
Admission: RE | Admit: 2016-07-11 | Discharge: 2016-07-11 | Disposition: A | Discharge status: Home / Self Care | Payer: Medicare Other | Source: Ambulatory Visit | Attending: Internal Medicine | Admitting: Internal Medicine

## 2016-07-11 DIAGNOSIS — N433 Hydrocele, unspecified: Secondary | ICD-10-CM | POA: Diagnosis not present

## 2016-07-11 DIAGNOSIS — N5082 Scrotal pain: Secondary | ICD-10-CM | POA: Insufficient documentation

## 2016-07-12 NOTE — Addendum Note (Signed)
Addended byConrad [place] D on: 07/12/2016 10:42 AM   Modules accepted: Orders

## 2016-07-15 ENCOUNTER — Ambulatory Visit: Payer: PRIVATE HEALTH INSURANCE | Admitting: Internal Medicine

## 2016-07-15 ENCOUNTER — Ambulatory Visit: Payer: Medicare Other | Admitting: Internal Medicine

## 2016-11-14 ENCOUNTER — Telehealth: Payer: Self-pay | Admitting: Internal Medicine

## 2016-11-14 ENCOUNTER — Ambulatory Visit (INDEPENDENT_AMBULATORY_CARE_PROVIDER_SITE_OTHER): Payer: Medicare Other | Admitting: Internal Medicine

## 2016-11-14 VITALS — BP 128/80 | HR 52 | Temp 98.0°F | Resp 14 | Ht 72.0 in | Wt 177.2 lb

## 2016-11-14 DIAGNOSIS — F32A Depression, unspecified: Secondary | ICD-10-CM

## 2016-11-14 DIAGNOSIS — I10 Essential (primary) hypertension: Secondary | ICD-10-CM | POA: Diagnosis not present

## 2016-11-14 DIAGNOSIS — I25810 Atherosclerosis of coronary artery bypass graft(s) without angina pectoris: Secondary | ICD-10-CM

## 2016-11-14 DIAGNOSIS — E785 Hyperlipidemia, unspecified: Secondary | ICD-10-CM | POA: Diagnosis not present

## 2016-11-14 DIAGNOSIS — F329 Major depressive disorder, single episode, unspecified: Secondary | ICD-10-CM | POA: Diagnosis not present

## 2016-11-14 LAB — COMPREHENSIVE METABOLIC PANEL
ALT: 28 U/L (ref 0–53)
AST: 26 U/L (ref 0–37)
Albumin: 4.3 g/dL (ref 3.5–5.2)
Alkaline Phosphatase: 61 U/L (ref 39–117)
BILIRUBIN TOTAL: 1.1 mg/dL (ref 0.2–1.2)
BUN: 25 mg/dL — AB (ref 6–23)
CO2: 26 mEq/L (ref 19–32)
CREATININE: 1.41 mg/dL (ref 0.40–1.50)
Calcium: 10.3 mg/dL (ref 8.4–10.5)
Chloride: 108 mEq/L (ref 96–112)
GFR: 51.39 mL/min — AB (ref >60.00)
Glucose, Bld: 89 mg/dL (ref 70–99)
Potassium: 4.5 mEq/L (ref 3.5–5.1)
Sodium: 139 mEq/L (ref 135–145)
Total Protein: 7.3 g/dL (ref 6.0–8.3)

## 2016-11-14 LAB — CBC WITH DIFFERENTIAL/PLATELET
BASOS ABS: 0 10*3/uL (ref 0.0–0.1)
Basophils Relative: 0.6 % (ref 0.0–3.0)
EOS ABS: 0.1 10*3/uL (ref 0.0–0.7)
Eosinophils Relative: 1.7 % (ref 0.0–5.0)
HCT: 44.7 % (ref 39.0–52.0)
HEMOGLOBIN: 14.6 g/dL (ref 13.0–17.0)
LYMPHS PCT: 29 % (ref 12.0–46.0)
Lymphs Abs: 1.4 10*3/uL (ref 0.7–4.0)
MCHC: 32.7 g/dL (ref 30.0–36.0)
MCV: 90.8 fl (ref 78.0–100.0)
Monocytes Absolute: 0.4 10*3/uL (ref 0.1–1.0)
Monocytes Relative: 8.6 % (ref 3.0–12.0)
Neutro Abs: 2.8 10*3/uL (ref 1.4–7.7)
Neutrophils Relative %: 60.1 % (ref 43.0–77.0)
Platelets: 166 10*3/uL (ref 150.0–400.0)
RBC: 4.93 Mil/uL (ref 4.22–5.81)
RDW: 15.5 % (ref 11.5–15.5)
WBC: 4.7 10*3/uL (ref 4.0–10.5)

## 2016-11-14 LAB — LIPID PANEL
CHOL/HDL RATIO: 3
Cholesterol: 116 mg/dL (ref 0–200)
HDL: 41.2 mg/dL (ref >39.00)
LDL CALC: 61 mg/dL (ref 0–99)
NonHDL: 74.8
Triglycerides: 69 mg/dL (ref 0.0–149.0)
VLDL: 13.8 mg/dL (ref 0.0–40.0)

## 2016-11-14 LAB — TSH: TSH: 1.66 u[IU]/mL (ref 0.35–4.50)

## 2016-11-14 NOTE — Progress Notes (Signed)
Subjective:    Patient ID: Adam Buchanan., male    DOB: [birth year], 82 y.o.   MRN: [id number]  DOS:  11/14/2016 Type of visit - description : rov Interval history: I spoke with the wife, she has several concerns:she reports the patient is looking weaker in general  to her, less balance, no falls. Decrease short memory?. Pt has always been intense and sometimes agitated, also some compulsive behavior. She thinks those traits are  more noticeable lately. He is sleeping more than before I asked her if she thinks is depressed and she said "probably" because they have a disabled son.  The patient himself reports that he is doing well, good compliance of medication. Admits to some fatigue. Denies major problems with depression or anxiety although recognizes it is difficult to take care of his son.   Review of Systems No fever chills No chest pain or difficulty breathing No nausea, vomiting, diarrhea. No blood in the stools. No headache, no difficulty urinating. Bowel movements are regular. No constipation  Past Medical History:  Diagnosis Date  . Atrial fibrillation ([place]) 02/2015  . BPH (benign prostatic hyperplasia)   . CAD (coronary artery disease) 08/09/2011   CAD, MI 07-2010, stents, then CABG 9-12  Cards -- f/u in [place]    . ELEVATED BLOOD PRESSURE WITHOUT DIAGNOSIS OF HYPERTENSION 05/19/2009   Qualifier: Diagnosis of  By: Larose Kells MD, [place] Hyperlipidemia   . Mitral regurgitation 2012   seen on echo    Past Surgical History:  Procedure Laterality Date  . CARDIOVERSION  2012  . CORONARY ARTERY BYPASS GRAFT     (x5) 12-10-2010  . FACIAL FRACTURE SURGERY    . TONSILECTOMY, ADENOIDECTOMY, BILATERAL MYRINGOTOMY AND TUBES      Social History   Social History  . Marital status: Married    Spouse name: N/A  . Number of children: 2  . Years of education: N/A   Occupational History  . retired    Social History Main Topics  . Smoking status: Former Research scientist (life sciences)  . Smokeless  tobacco: Never Used     Comment: quit 1990s  . Alcohol use Yes     Comment: socially  . Drug use: No  . Sexual activity: Not on file   Other Topics Concern  . Not on file   Social History Narrative   Married , household-- pt, wife, disabled son (DM-amputee)       Allergies as of 11/14/2016   No Known Allergies     Medication List       Accurate as of 11/14/16  6:35 PM. Always use your most recent med list.          aspirin 81 MG tablet Take 81 mg by mouth daily.   atorvastatin 80 MG tablet Commonly known as:  LIPITOR Take 80 mg by mouth daily.   lisinopril 10 MG tablet Commonly known as:  PRINIVIL,ZESTRIL Take 10 mg by mouth daily.   metoprolol succinate 25 MG 24 hr tablet Commonly known as:  TOPROL-XL Take 25 mg by mouth daily.   multivitamin tablet Take 1 tablet by mouth daily.   nitroGLYCERIN 0.4 MG SL tablet Commonly known as:  NITROSTAT Place 0.4 mg under the tongue every 5 (five) minutes as needed.            Discharge Care Instructions        Start     Ordered   11/14/16 0000  Comp Met (CMET)  11/14/16 1047   11/14/16 0000  Lipid panel     11/14/16 1047   11/14/16 0000  CBC w/Diff     11/14/16 1047   11/14/16 0000  TSH     11/14/16 1047         Objective:   Physical Exam BP 128/80 (BP Location: Left Arm, Patient Position: Sitting, Cuff Size: Small)   Pulse (!) 52   Temp 98 F (36.7 C) (Oral)   Resp 14   Ht 6' (1.829 m)   Wt 177 lb 4 oz (80.4 kg)   SpO2 98%   BMI 24.04 kg/m  General:   Well developed, well nourished . NAD. He appears his age, seems to be feeling well physically and mentally.  HEENT:  Normocephalic . Face symmetric, atraumatic Lungs:  CTA B Normal respiratory effort, no intercostal retractions, no accessory muscle use. Heart: RRR,  no murmur.  No pretibial edema bilaterally  Skin: Not pale. Not jaundice Neurologic:  alert & oriented X3.  Speech normal, gait appropriate for age and unassisted MMSE:  68 Psych--  Cognition and judgment appear intact.  Cooperative with normal attention span and concentration.  Behavior appropriate. No anxious or depressed appearing.      Assessment & Plan:   Assessment > HTN Hyperlipidemia CRI  creat ~ 1.6,  US renal 2015 no obstruction  CV:  Cardiology @ [place] CAD: MI, stents 07-2010. CABG 11-2010. Atrial fib -- cardioversion 2012, on amiodarone  MR per echo  11-2010, no murmur, no need to repeat echo per cardiology note GU: --BPH --Elevated PSA reportedly had a bx Dr Terance Hart 2012 , 05-2013 PSA 12.11, last urology OV 03-2016  Dilated abd aorta per [place]  05-2013, repeat ultrasound 5 years Osteopenia: T score -1.1 (01-2016).  Plan: HTN: Continue lisinopril, metoprolol. Check a CMP, CBC. High cholesterol: On Lipitor, check FLP and TSH CAD: Asymptomatic Memory difficulties: Examination is normal Depression?, agitation: Patient denies, he seems to be at baseline. He is know to be occasionally  agitated, could be simply his personality. Recommend observation, also advised patient to consider counseling as he has a very difficult life with a disabled son. He reports his wife is also disabled due to severe arthritis. RTC 3 months.  Today, I spent more than 40   min with the patient: >50% of the time counseling regards possible depression, stress. Also performing a MMSE and listening to his wife's concerns.

## 2016-11-14 NOTE — Patient Instructions (Signed)
GO TO THE LAB : Get the blood work     GO TO THE FRONT DESK Schedule your next appointment for a  checkup in 3-4 months   Get a flu shot this fall

## 2016-11-14 NOTE — Telephone Encounter (Signed)
I spoke with the wife and saw the pt today See OV note

## 2016-11-14 NOTE — Progress Notes (Signed)
Pre visit review using our clinic review tool, if applicable. No additional management support is needed unless otherwise documented below in the visit note. 

## 2016-11-14 NOTE — Assessment & Plan Note (Signed)
HTN: Continue lisinopril, metoprolol. Check a CMP, CBC. High cholesterol: On Lipitor, check FLP and TSH CAD: Asymptomatic Memory difficulties: Examination is normal Depression?, agitation: Patient denies, he seems to be at baseline. He is know to be occasionally  agitated, could be simply his personality. Recommend observation, also advised patient to consider counseling as he has a very difficult life with a disabled son. He reports his wife is also disabled due to severe arthritis. RTC 3 months.

## 2016-11-14 NOTE — Telephone Encounter (Signed)
°  Relation to [id number]  Call back [phone] Pharmacy:  Reason for call:  Spouse would like PCP to aware and address today at patient 10:30am follow up appointment, spouse states please done mentioned she called it will agitate him more:   Spouse states issues and concerns:  Leg weakness, unsteady gait but no falls, Spends a lot of time in the bathroom, short term memory, dementia,  Agitation Does not use hearing  Uses reading glasses to drive  Spouse states she would really appreciate if the nurse calls her to follow up with her regarding patient visit best # [phone]

## 2016-11-15 ENCOUNTER — Ambulatory Visit: Payer: Medicare Other | Admitting: Internal Medicine

## 2016-12-16 ENCOUNTER — Ambulatory Visit (INDEPENDENT_AMBULATORY_CARE_PROVIDER_SITE_OTHER): Payer: Medicare Other | Admitting: Behavioral Health

## 2016-12-16 DIAGNOSIS — Z23 Encounter for immunization: Secondary | ICD-10-CM

## 2017-01-02 DIAGNOSIS — R972 Elevated prostate specific antigen [PSA]: Secondary | ICD-10-CM | POA: Diagnosis not present

## 2017-01-02 DIAGNOSIS — R310 Gross hematuria: Secondary | ICD-10-CM | POA: Diagnosis not present

## 2017-01-02 DIAGNOSIS — N433 Hydrocele, unspecified: Secondary | ICD-10-CM | POA: Diagnosis not present

## 2017-01-02 DIAGNOSIS — N400 Enlarged prostate without lower urinary tract symptoms: Secondary | ICD-10-CM | POA: Diagnosis not present

## 2017-01-13 ENCOUNTER — Encounter: Payer: Self-pay | Admitting: Internal Medicine

## 2017-01-29 DIAGNOSIS — K8080 Other cholelithiasis without obstruction: Secondary | ICD-10-CM | POA: Diagnosis not present

## 2017-01-29 DIAGNOSIS — R310 Gross hematuria: Secondary | ICD-10-CM | POA: Diagnosis not present

## 2017-01-31 DIAGNOSIS — N200 Calculus of kidney: Secondary | ICD-10-CM | POA: Diagnosis not present

## 2017-01-31 DIAGNOSIS — N400 Enlarged prostate without lower urinary tract symptoms: Secondary | ICD-10-CM | POA: Diagnosis not present

## 2017-01-31 DIAGNOSIS — R310 Gross hematuria: Secondary | ICD-10-CM | POA: Diagnosis not present

## 2017-03-04 ENCOUNTER — Ambulatory Visit: Payer: Medicare Other | Admitting: Internal Medicine

## 2017-03-05 NOTE — Progress Notes (Signed)
Subjective:   Adam Buchanan. is a 82 y.o. male who presents for Medicare Annual/Subsequent preventive examination.  Review of Systems:  No ROS.  Medicare Wellness Visit. Additional risk factors are reflected in the social history.   Sleep patterns: Sleeps at least 8 hrs. Feels rested.   Home Safety/Smoke Alarms: Feels safe in home. Smoke alarms in place. Live with wife and disabled son. States he is the caregiver for both.  Male:   CCS- No longer doing routine screening due to age.    PSA-  Lab Results  Component Value Date   PSA 12.30 04/01/2016   PSA 12.13 (H) 01/17/2015   PSA 12.11 (H) 06/08/2013       Objective:    Vitals: BP 132/68 (BP Location: Right Arm, Patient Position: Sitting, Cuff Size: Small)   Pulse 62   Temp 97.8 F (36.6 C) (Oral)   Resp 14   Ht 6' (1.829 m)   Wt 176 lb 2 oz (79.9 kg)   SpO2 96%   BMI 23.89 kg/m   Body mass index is 23.89 kg/m.  Advanced Directives 03/07/2017  Does Patient Have a Medical Advance Directive? Yes  Type of Advance Directive Living will;Healthcare Power of Attorney  Copy of Healthcare Power of Attorney in Chart? No - copy requested    Tobacco Social History   Tobacco Use  Smoking Status Former Smoker  Smokeless Tobacco Never Used  Tobacco Comment   quit 1990s     Counseling given: No Comment: quit 1990s   Clinical Intake:   Pain : No/denies pain    Past Medical History:  Diagnosis Date  . Atrial fibrillation (HCC) 02/2015  . BPH (benign prostatic hyperplasia)   . CAD (coronary artery disease) 08/09/2011   CAD, MI 07-2010, stents, then CABG 9-12  Cards -- f/u in [place]    . ELEVATED BLOOD PRESSURE WITHOUT DIAGNOSIS OF HYPERTENSION 05/19/2009   Qualifier: Diagnosis of  By: Drue Novel MD, Jose E.   . Hyperlipidemia   . Mitral regurgitation 2012   seen on echo   Past Surgical History:  Procedure Laterality Date  . CARDIOVERSION  2012  . CORONARY ARTERY BYPASS GRAFT     (x5) 12-10-2010  . FACIAL FRACTURE  SURGERY    . TONSILECTOMY, ADENOIDECTOMY, BILATERAL MYRINGOTOMY AND TUBES     Family History  Problem Relation Age of Onset  . Hypertension Father   . Hypertension Maternal Grandmother   . Colon cancer Neg Hx   . Prostate cancer Neg Hx    Social History   Socioeconomic History  . Marital status: Married    Spouse name: None  . Number of children: 2  . Years of education: None  . Highest education level: None  Social Needs  . Financial resource strain: None  . Food insecurity - worry: None  . Food insecurity - inability: None  . Transportation needs - medical: None  . Transportation needs - non-medical: None  Occupational History  . Occupation: retired  Tobacco Use  . Smoking status: Former Games developer  . Smokeless tobacco: Never Used  . Tobacco comment: quit 1990s  Substance and Sexual Activity  . Alcohol use: Yes    Comment: socially  . Drug use: No  . Sexual activity: None  Other Topics Concern  . None  Social History Narrative   Married , household-- pt, wife, disabled son (DM-amputee)     Outpatient Encounter Medications as of 03/07/2017  Medication Sig  . aspirin 81 MG tablet  Take 81 mg by mouth daily.  Marland Kitchen. atorvastatin (LIPITOR) 80 MG tablet Take 80 mg by mouth daily.  . finasteride (PROSCAR) 5 MG tablet Take 5 mg by mouth daily.  Marland Kitchen. lisinopril (PRINIVIL,ZESTRIL) 10 MG tablet Take 10 mg by mouth daily.  . metoprolol succinate (TOPROL-XL) 25 MG 24 hr tablet Take 25 mg by mouth daily.  . Multiple Vitamin (MULTIVITAMIN) tablet Take 1 tablet by mouth daily.  . nitroGLYCERIN (NITROSTAT) 0.4 MG SL tablet Place 0.4 mg under the tongue every 5 (five) minutes as needed.   No facility-administered encounter medications on file as of 03/07/2017.     Activities of Daily Living In your present state of health, do you have any difficulty performing the following activities: 03/07/2017  Hearing? N  Vision? N  Comment wears glasses fo reading  Difficulty concentrating or  making decisions? N  Walking or climbing stairs? N  Dressing or bathing? N  Doing errands, shopping? N  Preparing Food and eating ? N  Using the Toilet? N  In the past six months, have you accidently leaked urine? N  Do you have problems with loss of bowel control? N  Managing your Medications? N  Managing your Finances? N  Housekeeping or managing your Housekeeping? N  Some recent data might be hidden    Timed Get Up and Go Performed:    Patient Care Team: Wanda PlumpPaz, Jose E, MD as PCP - General Rohrbeck, Salvatore DecentSteven C., MD (Cardiology) Jerilee FieldEskridge, Matthew, MD as Consulting Physician (Urology) Lanell Mataraniel, Kurt R., DO as Consulting Physician (Cardiology)   Assessment:    Physical assessment deferred to PCP.  Exercise Activities and Dietary recommendations Current Exercise Habits: The patient does not participate in regular exercise at present, Exercise limited by: None identified Diet (meal preparation, eat out, water intake, caffeinated beverages, dairy products, fruits and vegetables): in general, a "healthy" diet    Goals    . Maintain current health and independence      Fall Risk Fall Risk  03/07/2017 11/21/2015 01/17/2015 01/17/2015 06/08/2013  Falls in the past year? No No No No No   Depression Screen PHQ 2/9 Scores 03/07/2017 11/21/2015 01/17/2015 01/17/2015  PHQ - 2 Score 0 0 0 0    Cognitive Function MMSE - Mini Mental State Exam 03/07/2017  Orientation to time 5  Orientation to Place 5  Registration 3  Attention/ Calculation 5  Recall 0  Language- name 2 objects 2  Language- repeat 1  Language- follow 3 step command 3  Language- read & follow direction 1  Write a sentence 1  Copy design 1  Total score 27        Immunization History  Administered Date(s) Administered  . Influenza Split 02/21/2011, 01/29/2012  . Influenza Whole 12/16/2007, 01/05/2009, 01/08/2010  . Influenza, High Dose Seasonal PF 01/17/2015, 11/21/2015, 12/16/2016  . Influenza,inj,Quad PF,6+ Mos  12/22/2012, 12/09/2013  . Pneumococcal Conjugate-13 01/17/2015  . Pneumococcal Polysaccharide-23 05/19/2009  . Td 03/07/2017   Screening Tests Health Maintenance  Topic Date Due  . TETANUS/TDAP  03/08/2027  . INFLUENZA VACCINE  Completed  . PNA vac Low Risk Adult  Completed      Plan:   Follow up with Dr.Paz as directed.  Continue to eat heart healthy diet (full of fruits, vegetables, whole grains, lean protein, water--limit salt, fat, and sugar intake) and increase physical activity as tolerated.  Continue doing brain stimulating activities (puzzles, reading, adult coloring books, staying active) to keep memory sharp.    I have personally  reviewed and noted the following in the patient's chart:   . Medical and social history . Use of alcohol, tobacco or illicit drugs  . Current medications and supplements . Functional ability and status . Nutritional status . Physical activity . Advanced directives . List of other physicians . Hospitalizations, surgeries, and ER visits in previous 12 months . Vitals . Screenings to include cognitive, depression, and falls . Referrals and appointments  In addition, I have reviewed and discussed with patient certain preventive protocols, quality metrics, and best practice recommendations. A written personalized care plan for preventive services as well as general preventive health recommendations were provided to patient.     Mady HaagensenBritt, Rembert Browe ChilchinbitoAngel, [place]RN  03/07/2017  Willow OraJose Paz, MD

## 2017-03-07 ENCOUNTER — Ambulatory Visit (INDEPENDENT_AMBULATORY_CARE_PROVIDER_SITE_OTHER): Payer: Medicare Other | Admitting: Internal Medicine

## 2017-03-07 ENCOUNTER — Encounter: Payer: Self-pay | Admitting: Internal Medicine

## 2017-03-07 VITALS — BP 132/68 | HR 62 | Temp 97.8°F | Resp 14 | Ht 72.0 in | Wt 176.1 lb

## 2017-03-07 DIAGNOSIS — R319 Hematuria, unspecified: Secondary | ICD-10-CM | POA: Diagnosis not present

## 2017-03-07 DIAGNOSIS — Z23 Encounter for immunization: Secondary | ICD-10-CM | POA: Diagnosis not present

## 2017-03-07 DIAGNOSIS — E785 Hyperlipidemia, unspecified: Secondary | ICD-10-CM | POA: Diagnosis not present

## 2017-03-07 DIAGNOSIS — I10 Essential (primary) hypertension: Secondary | ICD-10-CM | POA: Diagnosis not present

## 2017-03-07 DIAGNOSIS — I25810 Atherosclerosis of coronary artery bypass graft(s) without angina pectoris: Secondary | ICD-10-CM

## 2017-03-07 DIAGNOSIS — Z0000 Encounter for general adult medical examination without abnormal findings: Secondary | ICD-10-CM

## 2017-03-07 NOTE — Assessment & Plan Note (Signed)
HTN: On lisinopril and metoprolol, ambulatory BP is 130/70.  No change, last BMP satisfactory High cholesterol: On Lipitor, last LDL satisfactory.  No change. Depression?  Occasional agitation: Things are at baseline.  I do not think he has a major psychological problem. Hematuria:  W/u reviewed, had a CT and cystoscopy, negative except for kidney stone and BPH.  Started finasteride with no apparent side effects. Td today RTC 6 months, declined earlier follow-up.

## 2017-03-07 NOTE — Patient Instructions (Addendum)
GO TO THE FRONT DESK Schedule your next appointment for a checkup in 6 months.  Fasting.    Check the  blood pressure 2 or 3 times a month  Be sure your blood pressure is between 110/65 and  135/85. If it is consistently higher or lower, let me know  Continue to eat heart healthy diet (full of fruits, vegetables, whole grains, lean protein, water--limit salt, fat, and sugar intake) and increase physical activity as tolerated.  Continue doing brain stimulating activities (puzzles, reading, adult coloring books, staying active) to keep memory sharp.    [name] , Thank you for taking time to come for your Medicare Wellness Visit. I appreciate your ongoing commitment to your health goals. Please review the following plan we discussed and let me know if I can assist you in the future.   These are the goals we discussed: Goals    . Maintain current health and independence       This is a list of the screening recommended for you and due dates:  Health Maintenance  Topic Date Due  . Tetanus Vaccine  03/08/2027  . Flu Shot  Completed  . Pneumonia vaccines  Completed    Health Maintenance, Male A healthy lifestyle and preventive care is important for your health and wellness. Ask your health care provider about what schedule of regular examinations is right for you. What should I know about weight and diet? Eat a Healthy Diet  Eat plenty of vegetables, fruits, whole grains, low-fat dairy products, and lean protein.  Do not eat a lot of foods high in solid fats, added sugars, or salt.  Maintain a Healthy Weight Regular exercise can help you achieve or maintain a healthy weight. You should:  Do at least 150 minutes of exercise each week. The exercise should increase your heart rate and make you sweat (moderate-intensity exercise).  Do strength-training exercises at least twice a week.  Watch Your Levels of Cholesterol and Blood Lipids  Have your blood tested for lipids and  cholesterol every 5 years starting at 80 years of age. If you are at high risk for heart disease, you should start having your blood tested when you are 80 years old. You may need to have your cholesterol levels checked more often if: ? Your lipid or cholesterol levels are high. ? You are older than 80 years of age. ? You are at high risk for heart disease.  What should I know about cancer screening? Many types of cancers can be detected early and may often be prevented. Lung Cancer  You should be screened every year for lung cancer if: ? You are a current smoker who has smoked for at least 30 years. ? You are a former smoker who has quit within the past 15 years.  Talk to your health care provider about your screening options, when you should start screening, and how often you should be screened.  Colorectal Cancer  Routine colorectal cancer screening usually begins at 80 years of age and should be repeated every 5-10 years until you are 80 years old. You may need to be screened more often if early forms of precancerous polyps or small growths are found. Your health care provider may recommend screening at an earlier age if you have risk factors for colon cancer.  Your health care provider may recommend using home test kits to check for hidden blood in the stool.  A small camera at the end of a tube can  be used to examine your colon (sigmoidoscopy or colonoscopy). This checks for the earliest forms of colorectal cancer.  Prostate and Testicular Cancer  Depending on your age and overall health, your health care provider may do certain tests to screen for prostate and testicular cancer.  Talk to your health care provider about any symptoms or concerns you have about testicular or prostate cancer.  Skin Cancer  Check your skin from head to toe regularly.  Tell your health care provider about any new moles or changes in moles, especially if: ? There is a change in a mole's size, shape,  or color. ? You have a mole that is larger than a pencil eraser.  Always use sunscreen. Apply sunscreen liberally and repeat throughout the day.  Protect yourself by wearing long sleeves, pants, a wide-brimmed hat, and sunglasses when outside.  What should I know about heart disease, diabetes, and high blood pressure?  If you are 1718-80 years of age, have your blood pressure checked every 3-5 years. If you are 80 years of age or older, have your blood pressure checked every year. You should have your blood pressure measured twice-once when you are at a hospital or clinic, and once when you are not at a hospital or clinic. Record the average of the two measurements. To check your blood pressure when you are not at a hospital or clinic, you can use: ? An automated blood pressure machine at a pharmacy. ? A home blood pressure monitor.  Talk to your health care provider about your target blood pressure.  If you are between 3645-80 years old, ask your health care provider if you should take aspirin to prevent heart disease.  Have regular diabetes screenings by checking your fasting blood sugar level. ? If you are at a normal weight and have a low risk for diabetes, have this test once every three years after the age of 80. ? If you are overweight and have a high risk for diabetes, consider being tested at a younger age or more often.  A one-time screening for abdominal aortic aneurysm (AAA) by ultrasound is recommended for men aged 65-75 years who are current or former smokers. What should I know about preventing infection? Hepatitis B If you have a higher risk for hepatitis B, you should be screened for this virus. Talk with your health care provider to find out if you are at risk for hepatitis B infection. Hepatitis C Blood testing is recommended for:  Everyone born from [id number] through 1963.  Anyone with known risk factors for hepatitis C.  Sexually Transmitted Diseases (STDs)  You should  be screened each year for STDs including gonorrhea and chlamydia if: ? You are sexually active and are younger than 80 years of age. ? You are older than 80 years of age and your health care provider tells you that you are at risk for this type of infection. ? Your sexual activity has changed since you were last screened and you are at an increased risk for chlamydia or gonorrhea. Ask your health care provider if you are at risk.  Talk with your health care provider about whether you are at high risk of being infected with HIV. Your health care provider may recommend a prescription medicine to help prevent HIV infection.  What else can I do?  Schedule regular health, dental, and eye exams.  Stay current with your vaccines (immunizations).  Do not use any tobacco products, such as cigarettes, chewing tobacco, and e-cigarettes.  If you need help quitting, ask your health care provider.  Limit alcohol intake to no more than 2 drinks per day. One drink equals 12 ounces of beer, 5 ounces of wine, or 1 ounces of hard liquor.  Do not use street drugs.  Do not share needles.  Ask your health care provider for help if you need support or information about quitting drugs.  Tell your health care provider if you often feel depressed.  Tell your health care provider if you have ever been abused or do not feel safe at home. This information is not intended to replace advice given to you by your health care provider. Make sure you discuss any questions you have with your health care provider. Document Released: 09/07/2007 Document Revised: 11/08/2015 Document Reviewed: 12/13/2014 Elsevier Interactive Patient Education  [employer].

## 2017-03-07 NOTE — Progress Notes (Signed)
Subjective:    Patient ID: Adam Fasterobert J Wahlstrom Jr., male    DOB: [birth year], 82 y.o.   MRN: [id number]  DOS:  03/07/2017 Type of visit - description : rov Interval history: See last visit, we had a long conversation about depression, agitation.  Patient feels well, his stress at baseline, sleeps without problems. He went to see urology, notes reviewed. HTN: Good compliance with medication, ambulatory BPs 130/70.   Review of Systems Denies chest pain or difficulty breathing. No anxiety. Occasional mild dizziness, last for seconds  Past Medical History:  Diagnosis Date  . Atrial fibrillation (HCC) 02/2015  . BPH (benign prostatic hyperplasia)   . CAD (coronary artery disease) 08/09/2011   CAD, MI 07-2010, stents, then CABG 9-12  Cards -- f/u in [place]    . ELEVATED BLOOD PRESSURE WITHOUT DIAGNOSIS OF HYPERTENSION 05/19/2009   Qualifier: Diagnosis of  By: Drue NovelPaz MD, Tyshana Nishida E.   . Hyperlipidemia   . Mitral regurgitation 2012   seen on echo    Past Surgical History:  Procedure Laterality Date  . CARDIOVERSION  2012  . CORONARY ARTERY BYPASS GRAFT     (x5) 12-10-2010  . FACIAL FRACTURE SURGERY    . TONSILECTOMY, ADENOIDECTOMY, BILATERAL MYRINGOTOMY AND TUBES      Social History   Socioeconomic History  . Marital status: Married    Spouse name: Not on file  . Number of children: 2  . Years of education: Not on file  . Highest education level: Not on file  Social Needs  . Financial resource strain: Not on file  . Food insecurity - worry: Not on file  . Food insecurity - inability: Not on file  . Transportation needs - medical: Not on file  . Transportation needs - non-medical: Not on file  Occupational History  . Occupation: retired  Tobacco Use  . Smoking status: Former Games developermoker  . Smokeless tobacco: Never Used  . Tobacco comment: quit 1990s  Substance and Sexual Activity  . Alcohol use: Yes    Comment: socially  . Drug use: No  . Sexual activity: Not on file  Other Topics  Concern  . Not on file  Social History Narrative   Married , household-- pt, wife, disabled son (DM-amputee)       Allergies as of 03/07/2017   No Known Allergies     Medication List        Accurate as of 03/07/17  5:24 PM. Always use your most recent med list.          aspirin 81 MG tablet Take 81 mg by mouth daily.   atorvastatin 80 MG tablet Commonly known as:  LIPITOR Take 80 mg by mouth daily.   finasteride 5 MG tablet Commonly known as:  PROSCAR Take 5 mg by mouth daily.   lisinopril 10 MG tablet Commonly known as:  PRINIVIL,ZESTRIL Take 10 mg by mouth daily.   metoprolol succinate 25 MG 24 hr tablet Commonly known as:  TOPROL-XL Take 25 mg by mouth daily.   multivitamin tablet Take 1 tablet by mouth daily.   nitroGLYCERIN 0.4 MG SL tablet Commonly known as:  NITROSTAT Place 0.4 mg under the tongue every 5 (five) minutes as needed.          Objective:   Physical Exam BP 132/68 (BP Location: Right Arm, Patient Position: Sitting, Cuff Size: Small)   Pulse 62   Temp 97.8 F (36.6 C) (Oral)   Resp 14   Ht 6' (1.829 m)  Wt 176 lb 2 oz (79.9 kg)   SpO2 96%   BMI 23.89 kg/m  General:   Well developed, well nourished . NAD.  HEENT:  Normocephalic . Face symmetric, atraumatic Lungs:  CTA B Normal respiratory effort, no intercostal retractions, no accessory muscle use. Heart: RRR,  no murmur.  No pretibial edema bilaterally  Skin: Not pale. Not jaundice Neurologic:  alert & oriented X3.  Speech normal, gait appropriate for age and unassisted Psych--  Cognition and judgment appear intact.  Cooperative with normal attention span and concentration.  Behavior appropriate. No anxious or depressed appearing.      Assessment & Plan:  Assessment > HTN Hyperlipidemia CRI  creat ~ 1.6,  us renal 2015 no obstruction  CV:  Cardiology @ [place] CAD: MI, stents 07-2010. CABG 11-2010. Atrial fib -- cardioversion 2012, on amiodarone  MR per echo   11-2010, no murmur, no need to repeat echo per cardiology note GU: --BPH, Elevated PSA reportedly had a bx Dr Vonita MossPeterson 2012 , 05-2013 PSA 12.11 -Hematuria, CT and cystoscopy late  2018, both ok, +urolithiasis( Dr. Mena GoesEskridge).  Rx finasteride Dilated abd aorta per us  05-2013, repeat ultrasound 5 years Osteopenia: T score -1.1 (01-2016).  Plan: HTN: On lisinopril and metoprolol, ambulatory BP is 130/70.  No change, last BMP satisfactory High cholesterol: On Lipitor, last LDL satisfactory.  No change. Depression?  Occasional agitation: Things are at baseline.  I do not think he has a major psychological problem. Hematuria:  W/u reviewed, had a CT and cystoscopy, negative except for kidney stone and BPH.  Started finasteride with no apparent side effects. Td today RTC 6 months, declined earlier follow-up.

## 2017-04-09 DIAGNOSIS — I25118 Atherosclerotic heart disease of native coronary artery with other forms of angina pectoris: Secondary | ICD-10-CM | POA: Diagnosis not present

## 2017-04-09 DIAGNOSIS — I480 Paroxysmal atrial fibrillation: Secondary | ICD-10-CM | POA: Diagnosis not present

## 2017-04-09 DIAGNOSIS — E7800 Pure hypercholesterolemia, unspecified: Secondary | ICD-10-CM | POA: Diagnosis not present

## 2017-04-09 DIAGNOSIS — I340 Nonrheumatic mitral (valve) insufficiency: Secondary | ICD-10-CM | POA: Diagnosis not present

## 2017-04-09 DIAGNOSIS — Z951 Presence of aortocoronary bypass graft: Secondary | ICD-10-CM | POA: Diagnosis not present

## 2017-04-09 DIAGNOSIS — I252 Old myocardial infarction: Secondary | ICD-10-CM | POA: Diagnosis not present

## 2017-04-09 DIAGNOSIS — I10 Essential (primary) hypertension: Secondary | ICD-10-CM | POA: Diagnosis not present

## 2017-08-21 DIAGNOSIS — R972 Elevated prostate specific antigen [PSA]: Secondary | ICD-10-CM | POA: Diagnosis not present

## 2017-08-21 DIAGNOSIS — N200 Calculus of kidney: Secondary | ICD-10-CM | POA: Diagnosis not present

## 2017-08-21 DIAGNOSIS — N400 Enlarged prostate without lower urinary tract symptoms: Secondary | ICD-10-CM | POA: Diagnosis not present

## 2017-08-21 LAB — PSA: PSA: 9.52

## 2017-09-05 ENCOUNTER — Ambulatory Visit: Payer: Medicare Other | Admitting: Internal Medicine

## 2017-09-10 ENCOUNTER — Ambulatory Visit (INDEPENDENT_AMBULATORY_CARE_PROVIDER_SITE_OTHER): Payer: Medicare Other | Admitting: Internal Medicine

## 2017-09-10 ENCOUNTER — Encounter: Payer: Self-pay | Admitting: Internal Medicine

## 2017-09-10 VITALS — BP 126/64 | HR 43 | Temp 97.6°F | Resp 16 | Ht 72.0 in | Wt 171.1 lb

## 2017-09-10 DIAGNOSIS — E875 Hyperkalemia: Secondary | ICD-10-CM

## 2017-09-10 DIAGNOSIS — I10 Essential (primary) hypertension: Secondary | ICD-10-CM | POA: Diagnosis not present

## 2017-09-10 DIAGNOSIS — E785 Hyperlipidemia, unspecified: Secondary | ICD-10-CM | POA: Diagnosis not present

## 2017-09-10 LAB — COMPREHENSIVE METABOLIC PANEL
ALBUMIN: 4.4 g/dL (ref 3.5–5.2)
ALK PHOS: 72 U/L (ref 39–117)
ALT: 37 U/L (ref 0–53)
AST: 29 U/L (ref 0–37)
BUN: 27 mg/dL — AB (ref 6–23)
CALCIUM: 10.1 mg/dL (ref 8.4–10.5)
CO2: 28 mEq/L (ref 19–32)
CREATININE: 1.53 mg/dL — AB (ref 0.40–1.50)
Chloride: 108 mEq/L (ref 96–112)
GFR: 46.67 mL/min — ABNORMAL LOW (ref >60.00)
Glucose, Bld: 92 mg/dL (ref 70–99)
POTASSIUM: 5.3 meq/L — AB (ref 3.5–5.1)
SODIUM: 142 meq/L (ref 135–145)
TOTAL PROTEIN: 7 g/dL (ref 6.0–8.3)
Total Bilirubin: 1 mg/dL (ref 0.2–1.2)

## 2017-09-10 LAB — LIPID PANEL
CHOLESTEROL: 122 mg/dL (ref 0–200)
HDL: 49.5 mg/dL (ref >39.00)
LDL Cholesterol: 62 mg/dL (ref 0–99)
NonHDL: 72.76
TRIGLYCERIDES: 54 mg/dL (ref 0.0–149.0)
Total CHOL/HDL Ratio: 2
VLDL: 10.8 mg/dL (ref 0.0–40.0)

## 2017-09-10 NOTE — Progress Notes (Signed)
Subjective:    Patient ID: Adam Buchanan., male    DOB: July 05, 1934, 83 y.o.   MRN: [id number]  DOS:  09/10/2017 Type of visit - description : ROV Interval history: Feels well, good compliance with medications, no major concerns. Recently saw urology and got good reports.   Review of Systems  Has no chest pain or difficulty breathing No nausea, vomiting, diarrhea.  No palpitations  Past Medical History:  Diagnosis Date  . Atrial fibrillation (HCC) 02/2015  . BPH (benign prostatic hyperplasia)   . CAD (coronary artery disease) 08/09/2011   CAD, MI 07-2010, stents, then CABG 9-12  Cards -- f/u in [place]    . ELEVATED BLOOD PRESSURE WITHOUT DIAGNOSIS OF HYPERTENSION 05/19/2009   Qualifier: Diagnosis of  By: Drue Novel MD, Jose E.   . Hyperlipidemia   . Mitral regurgitation 2012   seen on echo    Past Surgical History:  Procedure Laterality Date  . CARDIOVERSION  2012  . CORONARY ARTERY BYPASS GRAFT     (x5) 12-10-2010  . FACIAL FRACTURE SURGERY    . TONSILECTOMY, ADENOIDECTOMY, BILATERAL MYRINGOTOMY AND TUBES      Social History   Socioeconomic History  . Marital status: Married    Spouse name: Not on file  . Number of children: 2  . Years of education: Not on file  . Highest education level: Not on file  Occupational History  . Occupation: retired  Engineer, production  . Financial resource strain: Not on file  . Food insecurity:    Worry: Not on file    Inability: Not on file  . Transportation needs:    Medical: Not on file    Non-medical: Not on file  Tobacco Use  . Smoking status: Former Games developer  . Smokeless tobacco: Never Used  . Tobacco comment: quit 1990s  Substance and Sexual Activity  . Alcohol use: Yes    Comment: socially  . Drug use: No  . Sexual activity: Not on file  Lifestyle  . Physical activity:    Days per week: Not on file    Minutes per session: Not on file  . Stress: Not on file  Relationships  . Social connections:    Talks on phone: Not on file     Gets together: Not on file    Attends religious service: Not on file    Active member of club or organization: Not on file    Attends meetings of clubs or organizations: Not on file    Relationship status: Not on file  . Intimate partner violence:    Fear of current or ex partner: Not on file    Emotionally abused: Not on file    Physically abused: Not on file    Forced sexual activity: Not on file  Other Topics Concern  . Not on file  Social History Narrative   Married , household-- pt, wife, disabled son (DM-amputee)       Allergies as of 09/10/2017   No Known Allergies     Medication List        Accurate as of 09/10/17 11:59 PM. Always use your most recent med list.          aspirin 81 MG tablet Take 81 mg by mouth daily.   atorvastatin 80 MG tablet Commonly known as:  LIPITOR Take 80 mg by mouth daily.   finasteride 5 MG tablet Commonly known as:  PROSCAR Take 5 mg by mouth daily.   lisinopril 10  MG tablet Commonly known as:  PRINIVIL,ZESTRIL Take 10 mg by mouth daily.   metoprolol succinate 25 MG 24 hr tablet Commonly known as:  TOPROL-XL Take 25 mg by mouth daily.   multivitamin tablet Take 1 tablet by mouth daily.   nitroGLYCERIN 0.4 MG SL tablet Commonly known as:  NITROSTAT Place 0.4 mg under the tongue every 5 (five) minutes as needed.          Objective:   Physical Exam BP 126/64 (BP Location: Left Arm, Patient Position: Sitting, Cuff Size: Small)   Pulse (!) 43   Temp 97.6 F (36.4 C) (Oral)   Resp 16   Ht 6' (1.829 m)   Wt 171 lb 2 oz (77.6 kg)   SpO2 97%   BMI 23.21 kg/m  General:   Well developed, NAD, see BMI.  HEENT:  Normocephalic . Face symmetric, atraumatic Lungs:  CTA B Normal respiratory effort, no intercostal retractions, no accessory muscle use. Heart: RRR,  no murmur.  Trace pretibial edema bilaterally with some skin hyperpigmentation Skin: Not pale. Not jaundice Neurologic:  alert & oriented X3.  Speech  normal, gait appropriate for age and unassisted Psych--  Cognition and judgment appear intact.  Cooperative with normal attention span and concentration.  Behavior appropriate. No anxious or depressed appearing.      Assessment & Plan:   Assessment HTN Hyperlipidemia CRI  creat ~ 1.6,  us renal 2015 no obstruction  CV:  Cardiology @ [place] CAD: MI, stents 07-2010. CABG 11-2010. Atrial fib -- cardioversion 2012, on amiodarone  MR per echo  11-2010, no murmur, no need to repeat echo per cardiology note GU: --BPH, Elevated PSA reportedly had a bx Dr Vonita MossPeterson 2012 , 05-2013 PSA 12.11 -Hematuria, CT and cystoscopy late  2018, both ok, +urolithiasis( Dr. Mena GoesEskridge).  Rx finasteride Dilated abd aorta per us  05-2013, repeat ultrasound 5 years Osteopenia: T score -1.1 (01-2016).  Plan: HTN: Seems controlled on lisinopril, Toprol.  Checking CMP. Hyperlipidemia: On Lipitor, checking FLP Chronic renal insufficiency: Labs GU: Reportedly saw urology recently and was told that the medication was working and was  "shrinking" his prostate. RTC 8 months.

## 2017-09-10 NOTE — Progress Notes (Signed)
Pre visit review using our clinic review tool, if applicable. No additional management support is needed unless otherwise documented below in the visit note. 

## 2017-09-10 NOTE — Patient Instructions (Signed)
GO TO THE LAB : Get the blood work     GO TO THE FRONT DESK Schedule your next appointment for a checkup 8 months    Check the  blood pressure   monthly   Be sure your blood pressure is between 110/65 and  135/85. If it is consistently higher or lower, let me know

## 2017-09-11 NOTE — Assessment & Plan Note (Signed)
HTN: Seems controlled on lisinopril, Toprol.  Checking CMP. Hyperlipidemia: On Lipitor, checking FLP Chronic renal insufficiency: Labs GU: Reportedly saw urology recently and was told that the medication was working and was  "shrinking" his prostate. RTC 8 months.

## 2017-10-13 ENCOUNTER — Other Ambulatory Visit (INDEPENDENT_AMBULATORY_CARE_PROVIDER_SITE_OTHER): Payer: Medicare Other

## 2017-10-13 DIAGNOSIS — E875 Hyperkalemia: Secondary | ICD-10-CM

## 2017-10-13 LAB — BASIC METABOLIC PANEL
BUN: 22 mg/dL (ref 6–23)
CHLORIDE: 107 meq/L (ref 96–112)
CO2: 25 mEq/L (ref 19–32)
CREATININE: 1.58 mg/dL — AB (ref 0.40–1.50)
Calcium: 9.8 mg/dL (ref 8.4–10.5)
GFR: 44.96 mL/min — ABNORMAL LOW (ref >60.00)
Glucose, Bld: 91 mg/dL (ref 70–99)
Potassium: 4.2 mEq/L (ref 3.5–5.1)
Sodium: 141 mEq/L (ref 135–145)

## 2017-10-16 ENCOUNTER — Other Ambulatory Visit: Payer: Self-pay

## 2017-10-16 ENCOUNTER — Emergency Department (HOSPITAL_COMMUNITY): Payer: Medicare Other

## 2017-10-16 ENCOUNTER — Observation Stay (HOSPITAL_COMMUNITY)
Admission: EM | Admit: 2017-10-16 | Discharge: 2017-10-17 | Disposition: A | Discharge status: Home / Self Care | Payer: Medicare Other | Attending: Internal Medicine | Admitting: Internal Medicine

## 2017-10-16 ENCOUNTER — Encounter (HOSPITAL_COMMUNITY): Payer: Self-pay | Admitting: Emergency Medicine

## 2017-10-16 DIAGNOSIS — Z9889 Other specified postprocedural states: Secondary | ICD-10-CM | POA: Insufficient documentation

## 2017-10-16 DIAGNOSIS — I252 Old myocardial infarction: Secondary | ICD-10-CM | POA: Insufficient documentation

## 2017-10-16 DIAGNOSIS — R110 Nausea: Secondary | ICD-10-CM | POA: Diagnosis not present

## 2017-10-16 DIAGNOSIS — N19 Unspecified kidney failure: Secondary | ICD-10-CM

## 2017-10-16 DIAGNOSIS — N183 Chronic kidney disease, stage 3 (moderate): Secondary | ICD-10-CM | POA: Insufficient documentation

## 2017-10-16 DIAGNOSIS — I129 Hypertensive chronic kidney disease with stage 1 through stage 4 chronic kidney disease, or unspecified chronic kidney disease: Secondary | ICD-10-CM | POA: Insufficient documentation

## 2017-10-16 DIAGNOSIS — I10 Essential (primary) hypertension: Secondary | ICD-10-CM | POA: Diagnosis not present

## 2017-10-16 DIAGNOSIS — N136 Pyonephrosis: Secondary | ICD-10-CM | POA: Diagnosis not present

## 2017-10-16 DIAGNOSIS — I340 Nonrheumatic mitral (valve) insufficiency: Secondary | ICD-10-CM | POA: Insufficient documentation

## 2017-10-16 DIAGNOSIS — R1032 Left lower quadrant pain: Secondary | ICD-10-CM | POA: Diagnosis not present

## 2017-10-16 DIAGNOSIS — R1084 Generalized abdominal pain: Secondary | ICD-10-CM | POA: Diagnosis not present

## 2017-10-16 DIAGNOSIS — Z951 Presence of aortocoronary bypass graft: Secondary | ICD-10-CM | POA: Diagnosis not present

## 2017-10-16 DIAGNOSIS — Z79899 Other long term (current) drug therapy: Secondary | ICD-10-CM | POA: Diagnosis not present

## 2017-10-16 DIAGNOSIS — R319 Hematuria, unspecified: Secondary | ICD-10-CM

## 2017-10-16 DIAGNOSIS — R109 Unspecified abdominal pain: Secondary | ICD-10-CM

## 2017-10-16 DIAGNOSIS — I4892 Unspecified atrial flutter: Secondary | ICD-10-CM | POA: Diagnosis not present

## 2017-10-16 DIAGNOSIS — R5383 Other fatigue: Secondary | ICD-10-CM | POA: Diagnosis not present

## 2017-10-16 DIAGNOSIS — I700 Atherosclerosis of aorta: Secondary | ICD-10-CM | POA: Insufficient documentation

## 2017-10-16 DIAGNOSIS — I480 Paroxysmal atrial fibrillation: Secondary | ICD-10-CM | POA: Diagnosis not present

## 2017-10-16 DIAGNOSIS — N200 Calculus of kidney: Secondary | ICD-10-CM | POA: Diagnosis not present

## 2017-10-16 DIAGNOSIS — N529 Male erectile dysfunction, unspecified: Secondary | ICD-10-CM | POA: Diagnosis not present

## 2017-10-16 DIAGNOSIS — M5136 Other intervertebral disc degeneration, lumbar region: Secondary | ICD-10-CM | POA: Insufficient documentation

## 2017-10-16 DIAGNOSIS — K8020 Calculus of gallbladder without cholecystitis without obstruction: Secondary | ICD-10-CM | POA: Insufficient documentation

## 2017-10-16 DIAGNOSIS — Z8249 Family history of ischemic heart disease and other diseases of the circulatory system: Secondary | ICD-10-CM | POA: Diagnosis not present

## 2017-10-16 DIAGNOSIS — N390 Urinary tract infection, site not specified: Secondary | ICD-10-CM

## 2017-10-16 DIAGNOSIS — I2510 Atherosclerotic heart disease of native coronary artery without angina pectoris: Secondary | ICD-10-CM | POA: Diagnosis not present

## 2017-10-16 DIAGNOSIS — Z87891 Personal history of nicotine dependence: Secondary | ICD-10-CM | POA: Insufficient documentation

## 2017-10-16 DIAGNOSIS — R001 Bradycardia, unspecified: Secondary | ICD-10-CM | POA: Diagnosis not present

## 2017-10-16 DIAGNOSIS — E785 Hyperlipidemia, unspecified: Secondary | ICD-10-CM | POA: Diagnosis not present

## 2017-10-16 DIAGNOSIS — I4891 Unspecified atrial fibrillation: Secondary | ICD-10-CM

## 2017-10-16 DIAGNOSIS — N12 Tubulo-interstitial nephritis, not specified as acute or chronic: Secondary | ICD-10-CM

## 2017-10-16 DIAGNOSIS — N400 Enlarged prostate without lower urinary tract symptoms: Secondary | ICD-10-CM

## 2017-10-16 DIAGNOSIS — N401 Enlarged prostate with lower urinary tract symptoms: Secondary | ICD-10-CM | POA: Insufficient documentation

## 2017-10-16 DIAGNOSIS — N132 Hydronephrosis with renal and ureteral calculous obstruction: Secondary | ICD-10-CM | POA: Diagnosis not present

## 2017-10-16 LAB — COMPREHENSIVE METABOLIC PANEL
ALBUMIN: 4.3 g/dL (ref 3.5–5.0)
ALT: 44 U/L (ref 0–44)
ANION GAP: 12 (ref 5–15)
AST: 40 U/L (ref 15–41)
Alkaline Phosphatase: 63 U/L (ref 38–126)
BILIRUBIN TOTAL: 1.5 mg/dL — AB (ref 0.3–1.2)
BUN: 18 mg/dL (ref 8–23)
CHLORIDE: 108 mmol/L (ref 98–111)
CO2: 21 mmol/L — ABNORMAL LOW (ref 22–32)
Calcium: 10.6 mg/dL — ABNORMAL HIGH (ref 8.9–10.3)
Creatinine, Ser: 1.8 mg/dL — ABNORMAL HIGH (ref 0.61–1.24)
GFR calc Af Amer: 39 mL/min — ABNORMAL LOW (ref >60)
GFR calc non Af Amer: 34 mL/min — ABNORMAL LOW (ref >60)
GLUCOSE: 137 mg/dL — AB (ref 70–99)
POTASSIUM: 4.3 mmol/L (ref 3.5–5.1)
SODIUM: 141 mmol/L (ref 135–145)
TOTAL PROTEIN: 7.5 g/dL (ref 6.5–8.1)

## 2017-10-16 LAB — URINALYSIS, ROUTINE W REFLEX MICROSCOPIC
BILIRUBIN URINE: NEGATIVE
Glucose, UA: NEGATIVE mg/dL
KETONES UR: NEGATIVE mg/dL
Nitrite: POSITIVE — AB
PROTEIN: NEGATIVE mg/dL
Specific Gravity, Urine: 1.012 (ref 1.005–1.030)
pH: 6 (ref 5.0–8.0)

## 2017-10-16 LAB — CBC WITH DIFFERENTIAL/PLATELET
Abs Immature Granulocytes: 0 10*3/uL (ref 0.0–0.1)
BASOS ABS: 0 10*3/uL (ref 0.0–0.1)
BASOS PCT: 1 %
EOS ABS: 0 10*3/uL (ref 0.0–0.7)
Eosinophils Relative: 1 %
HEMATOCRIT: 48.8 % (ref 39.0–52.0)
Hemoglobin: 15.9 g/dL (ref 13.0–17.0)
Immature Granulocytes: 0 %
Lymphocytes Relative: 12 %
Lymphs Abs: 1 10*3/uL (ref 0.7–4.0)
MCH: 30.1 pg (ref 26.0–34.0)
MCHC: 32.6 g/dL (ref 30.0–36.0)
MCV: 92.2 fL (ref 78.0–100.0)
Monocytes Absolute: 0.6 10*3/uL (ref 0.1–1.0)
Monocytes Relative: 8 %
NEUTROS PCT: 78 %
Neutro Abs: 6.5 10*3/uL (ref 1.7–7.7)
PLATELETS: 156 10*3/uL (ref 150–400)
RBC: 5.29 MIL/uL (ref 4.22–5.81)
RDW: 13.5 % (ref 11.5–15.5)
WBC: 8.2 10*3/uL (ref 4.0–10.5)

## 2017-10-16 LAB — LIPASE, BLOOD: Lipase: 46 U/L (ref 11–51)

## 2017-10-16 LAB — I-STAT CG4 LACTIC ACID, ED: Lactic Acid, Venous: 2.99 mmol/L (ref 0.5–1.9)

## 2017-10-16 MED ORDER — HYDROCODONE-ACETAMINOPHEN 5-325 MG PO TABS
1.0000 | ORAL_TABLET | ORAL | Status: DC | PRN
  Administered 2017-10-16: 2 via ORAL
  Filled 2017-10-16: qty 2

## 2017-10-16 MED ORDER — MORPHINE SULFATE (PF) 4 MG/ML IV SOLN
4.0000 mg | Freq: Once | INTRAVENOUS | Status: AC
  Administered 2017-10-16: 4 mg via INTRAVENOUS
  Filled 2017-10-16: qty 1

## 2017-10-16 MED ORDER — PHENYLEPHRINE HCL 0.5 % NA SOLN
1.0000 [drp] | Freq: Four times a day (QID) | NASAL | Status: DC | PRN
  Filled 2017-10-16: qty 15

## 2017-10-16 MED ORDER — ONDANSETRON HCL 4 MG/2ML IJ SOLN
4.0000 mg | Freq: Four times a day (QID) | INTRAMUSCULAR | Status: DC | PRN
  Administered 2017-10-16: 4 mg via INTRAVENOUS
  Filled 2017-10-16: qty 2

## 2017-10-16 MED ORDER — SODIUM CHLORIDE 0.9 % IV BOLUS
1000.0000 mL | Freq: Once | INTRAVENOUS | Status: AC
  Administered 2017-10-16: 1000 mL via INTRAVENOUS

## 2017-10-16 MED ORDER — SODIUM CHLORIDE 0.9 % IV SOLN
1.0000 g | INTRAVENOUS | Status: DC
  Filled 2017-10-16: qty 10

## 2017-10-16 MED ORDER — ENOXAPARIN SODIUM 30 MG/0.3ML ~~LOC~~ SOLN
30.0000 mg | SUBCUTANEOUS | Status: DC
  Administered 2017-10-16: 30 mg via SUBCUTANEOUS
  Filled 2017-10-16: qty 0.3

## 2017-10-16 MED ORDER — ACETAMINOPHEN 650 MG RE SUPP: 650.0000 mg | Freq: Four times a day (QID) | RECTAL | Status: DC | PRN

## 2017-10-16 MED ORDER — TAMSULOSIN HCL 0.4 MG PO CAPS
0.4000 mg | ORAL_CAPSULE | Freq: Every day | ORAL | Status: DC
  Administered 2017-10-16 – 2017-10-17 (×2): 0.4 mg via ORAL
  Filled 2017-10-16 (×2): qty 1

## 2017-10-16 MED ORDER — LISINOPRIL 10 MG PO TABS
10.0000 mg | ORAL_TABLET | Freq: Every day | ORAL | Status: DC
  Administered 2017-10-17: 10 mg via ORAL
  Filled 2017-10-16: qty 1

## 2017-10-16 MED ORDER — SODIUM CHLORIDE 0.9 % IV SOLN
INTRAVENOUS | Status: DC
  Administered 2017-10-16 – 2017-10-17 (×2): via INTRAVENOUS

## 2017-10-16 MED ORDER — HYDRALAZINE HCL 20 MG/ML IJ SOLN: 10.0000 mg | Freq: Four times a day (QID) | INTRAMUSCULAR | Status: DC | PRN

## 2017-10-16 MED ORDER — ACETAMINOPHEN 325 MG PO TABS: 650.0000 mg | ORAL_TABLET | Freq: Four times a day (QID) | ORAL | Status: DC | PRN

## 2017-10-16 MED ORDER — ONDANSETRON HCL 4 MG/2ML IJ SOLN
4.0000 mg | Freq: Once | INTRAMUSCULAR | Status: AC
  Administered 2017-10-16: 4 mg via INTRAVENOUS
  Filled 2017-10-16: qty 2

## 2017-10-16 MED ORDER — ADULT MULTIVITAMIN W/MINERALS CH
1.0000 | ORAL_TABLET | Freq: Every day | ORAL | Status: DC
  Administered 2017-10-16 – 2017-10-17 (×2): 1 via ORAL
  Filled 2017-10-16 (×4): qty 1

## 2017-10-16 MED ORDER — SODIUM CHLORIDE 0.9 % IV SOLN
2.0000 g | Freq: Once | INTRAVENOUS | Status: AC
  Administered 2017-10-16: 2 g via INTRAVENOUS
  Filled 2017-10-16: qty 20

## 2017-10-16 MED ORDER — NITROGLYCERIN 0.4 MG SL SUBL: 0.4000 mg | SUBLINGUAL_TABLET | SUBLINGUAL | Status: DC | PRN

## 2017-10-16 MED ORDER — METOPROLOL SUCCINATE ER 25 MG PO TB24
25.0000 mg | ORAL_TABLET | Freq: Every day | ORAL | Status: DC
  Administered 2017-10-16 – 2017-10-17 (×2): 25 mg via ORAL
  Filled 2017-10-16 (×3): qty 1

## 2017-10-16 MED ORDER — CEFTRIAXONE SODIUM 1 G IJ SOLR
1.0000 g | INTRAMUSCULAR | Status: DC
  Administered 2017-10-17: 1 g via INTRAVENOUS
  Filled 2017-10-16: qty 10

## 2017-10-16 MED ORDER — ASPIRIN EC 81 MG PO TBEC
81.0000 mg | DELAYED_RELEASE_TABLET | Freq: Every day | ORAL | Status: DC
  Administered 2017-10-16 – 2017-10-17 (×2): 81 mg via ORAL
  Filled 2017-10-16 (×2): qty 1

## 2017-10-16 MED ORDER — FINASTERIDE 5 MG PO TABS
5.0000 mg | ORAL_TABLET | Freq: Every day | ORAL | Status: DC
  Administered 2017-10-17: 5 mg via ORAL
  Filled 2017-10-16: qty 1

## 2017-10-16 MED ORDER — MORPHINE SULFATE (PF) 2 MG/ML IV SOLN: 2.0000 mg | INTRAVENOUS | Status: DC | PRN

## 2017-10-16 MED ORDER — ONDANSETRON HCL 4 MG PO TABS: 4.0000 mg | ORAL_TABLET | Freq: Four times a day (QID) | ORAL | Status: DC | PRN

## 2017-10-16 MED ORDER — ATORVASTATIN CALCIUM 80 MG PO TABS
80.0000 mg | ORAL_TABLET | Freq: Every day | ORAL | Status: DC
  Administered 2017-10-16 – 2017-10-17 (×2): 80 mg via ORAL
  Filled 2017-10-16 (×2): qty 1

## 2017-10-16 NOTE — ED Notes (Signed)
Patient transported to CT 

## 2017-10-16 NOTE — H&P (Signed)
[facility] History and Physical  Adam FasterRobert J Frate Jr. WUJ:[id number] DOB: 1935-01-27 DOA: 10/16/2017  PCP: Wanda PlumpPaz, Jose E, MD  Patient coming from: home  Chief Complaint: left flank pain  HPI: Adam FasterRobert J Montemayor Jr. is a 83 y.o. male with a medical history of atrial fibrillation, BPH, hypertension, hyperlipidemia presented to the emergency department with complaints of sudden onset of left sided flank pain.  Patient states the pain started today and has been excruciating, states it is 10 out of 10, with cramping aching and throbbing pain.  The pain worsens with any type of movement.  Recently received some pain medications in the ED therefore states pain has improved mildly.  Also reported some nausea without vomiting.  He also complains of some increased urinary frequency.  He does not feel that he has passed a kidney stone.  Patient states he does follow-up with urology and recently saw them last week for his prostate.  Currently he denies any chest pain, shortness of breath, abdominal pain, diarrhea or constipation, dizziness or headache, recent travel or sick contacts.  ED Course: CT renal stone study showing 13 mm calculus.  Urology was consulted and felt this to not be cause of patient's pain and no intervention needed at this time.  Patient given ceftriaxone and pain control.  [facility] called for admission.  Review of Systems:  All other systems reviewed and are negative.   Past Medical History:  Diagnosis Date  . Atrial fibrillation (HCC) 02/2015  . BPH (benign prostatic hyperplasia)   . CAD (coronary artery disease) 08/09/2011   CAD, MI 07-2010, stents, then CABG 9-12  Cards -- f/u in [place]    . ELEVATED BLOOD PRESSURE WITHOUT DIAGNOSIS OF HYPERTENSION 05/19/2009   Qualifier: Diagnosis of  By: Drue NovelPaz MD, Jose E.   . Hyperlipidemia   . Mitral regurgitation 2012   seen on echo    Past Surgical History:  Procedure Laterality Date  . CARDIOVERSION  2012  . CORONARY ARTERY BYPASS GRAFT     (x5)  12-10-2010  . FACIAL FRACTURE SURGERY    . TONSILECTOMY, ADENOIDECTOMY, BILATERAL MYRINGOTOMY AND TUBES      Social History:  reports that he has quit smoking. He has never used smokeless tobacco. He reports that he drinks alcohol. He reports that he does not use drugs.  No Known Allergies  Family History  Problem Relation Age of Onset  . Hypertension Father   . Hypertension Maternal Grandmother   . Colon cancer Neg Hx   . Prostate cancer Neg Hx      Prior to Admission medications   Medication Sig Start Date End Date Taking? Authorizing Provider  atorvastatin (LIPITOR) 80 MG tablet Take 80 mg by mouth daily.   Yes [provider]  finasteride (PROSCAR) 5 MG tablet Take 5 mg by mouth daily.   Yes [provider]  lisinopril (PRINIVIL,ZESTRIL) 10 MG tablet Take 10 mg by mouth daily.   Yes [provider]  metoprolol succinate (TOPROL-XL) 25 MG 24 hr tablet Take 25 mg by mouth daily.   Yes [provider]  Multiple Vitamin (MULTIVITAMIN) tablet Take 1 tablet by mouth daily.   Yes [provider]  nitroGLYCERIN (NITROSTAT) 0.4 MG SL tablet Place 0.4 mg under the tongue every 5 (five) minutes as needed.   Yes [provider]  phenylephrine (NEO-SYNEPHRINE) 0.5 % nasal solution Place 1 drop into both nostrils every 6 (six) hours as needed for congestion.   Yes [provider]  Physical Exam: Vitals:   10/16/17 1700 10/16/17 1730  BP:  (!) 171/81  Pulse: (!) 57 (!) 58  Resp: 18 (!) 26  Temp:    SpO2: 100% 98%     General: Well developed, well nourished, NAD, appears stated age  HEENT: NCAT, PERRLA, EOMI, Anicteic Sclera, mucous membranes moist.   Neck: Supple, no JVD, no masses  Cardiovascular: S1 S2 auscultated, irregular  Respiratory: Clear to auscultation bilaterally with equal chest rise  Abdomen: Soft, nontender, nondistended, + bowel sounds  Extremities: warm dry without cyanosis clubbing. RLE edema >  LLE (chronic)  Neuro: AAOx3, cranial nerves grossly intact. Strength 5/5 in patient's upper and lower extremities bilaterally  Skin: Without rashes exudates or nodules  Psych: Normal affect and demeanor with intact judgement and insight  Labs on Admission: I have personally reviewed following labs and imaging studies CBC: Recent Labs  Lab 10/16/17 1220  WBC 8.2  NEUTROABS 6.5  HGB 15.9  HCT 48.8  MCV 92.2  PLT 156   Basic Metabolic Panel: Recent Labs  Lab 10/13/17 0937 10/16/17 1220  NA 141 141  K 4.2 4.3  CL 107 108  CO2 25 21*  GLUCOSE 91 137*  BUN 22 18  CREATININE 1.58* 1.80*  CALCIUM 9.8 10.6*   GFR: Estimated Creatinine Clearance: 35.9 mL/min (A) (by C-G formula based on SCr of 1.8 mg/dL (H)). Liver Function Tests: Recent Labs  Lab 10/16/17 1220  AST 40  ALT 44  ALKPHOS 63  BILITOT 1.5*  PROT 7.5  ALBUMIN 4.3   Recent Labs  Lab 10/16/17 1220  LIPASE 46   No results for input(s): AMMONIA in the last 168 hours. Coagulation Profile: No results for input(s): INR, PROTIME in the last 168 hours. Cardiac Enzymes: No results for input(s): CKTOTAL, CKMB, CKMBINDEX, TROPONINI in the last 168 hours. BNP (last 3 results) No results for input(s): PROBNP in the last 8760 hours. HbA1C: No results for input(s): HGBA1C in the last 72 hours. CBG: No results for input(s): GLUCAP in the last 168 hours. Lipid Profile: No results for input(s): CHOL, HDL, LDLCALC, TRIG, CHOLHDL, LDLDIRECT in the last 72 hours. Thyroid Function Tests: No results for input(s): TSH, T4TOTAL, FREET4, T3FREE, THYROIDAB in the last 72 hours. Anemia Panel: No results for input(s): VITAMINB12, FOLATE, FERRITIN, TIBC, IRON, RETICCTPCT in the last 72 hours. Urine analysis:    Component Value Date/Time   COLORURINE YELLOW 10/16/2017 1514   APPEARANCEUR HAZY (A) 10/16/2017 1514   LABSPEC 1.012 10/16/2017 1514   PHURINE 6.0 10/16/2017 1514   GLUCOSEU NEGATIVE 10/16/2017 1514   HGBUR  LARGE (A) 10/16/2017 1514   BILIRUBINUR NEGATIVE 10/16/2017 1514   KETONESUR NEGATIVE 10/16/2017 1514   PROTEINUR NEGATIVE 10/16/2017 1514   NITRITE POSITIVE (A) 10/16/2017 1514   LEUKOCYTESUR SMALL (A) 10/16/2017 1514   Sepsis Labs: @LABRCNTIP (procalcitonin:4,lacticidven:4) )No results found for this or any previous visit (from the past 240 hour(s)).   Radiological Exams on Admission: Ct Renal Stone Study  Result Date: 10/16/2017 CLINICAL DATA:  Acute left lower quadrant abdominal pain. EXAM: CT ABDOMEN AND PELVIS WITHOUT CONTRAST TECHNIQUE: Multidetector CT imaging of the abdomen and pelvis was performed following the standard protocol without IV contrast. COMPARISON:  CT scan of January 29, 2017. FINDINGS: Lower chest: No acute abnormality. Hepatobiliary: Mild cholelithiasis is noted without inflammation. No biliary dilatation is noted. No abnormality is seen in the liver on these unenhanced images. Pancreas: Unremarkable. No pancreatic ductal dilatation or surrounding inflammatory changes. Spleen: Normal in size without  focal abnormality. Adrenals/Urinary Tract: Adrenal glands appear normal. Stable right renal cyst is noted. 13 mm calculus is seen in left lower infundibulum. Moderate left hydroureteronephrosis is noted with perinephric stranding without obstructing calculus. Urinary bladder is unremarkable. Stomach/Bowel: Stomach is within normal limits. Appendix appears normal. No evidence of bowel wall thickening, distention, or inflammatory changes. Vascular/Lymphatic: Aortic atherosclerosis. No enlarged abdominal or pelvic lymph nodes. Reproductive: Stable severe prostatic enlargement is noted. Other: No abdominal wall hernia or abnormality. No abdominopelvic ascites. Musculoskeletal: Multilevel degenerative disc disease is noted in the lumbar spine. No acute osseous abnormality is noted. IMPRESSION: 13 mm nonobstructive calculus is seen in inferior infundibulum of left intrarenal collecting  system. Moderate left hydronephrosis is noted with perinephric stranding without evidence of obstructing ureteral calculus. Potentially this may be due to distal ureteral spasm from recently passed stone. Mild cholelithiasis is noted without inflammation. Stable severe prostatic enlargement is noted. Multilevel degenerative disc disease is noted in the lumbar spine. Aortic Atherosclerosis (ICD10-I70.0). Electronically Signed   By: Lupita Raider, M.D.   On: 10/16/2017 16:45    EKG: Independently reviewed.  Atrial flutter, rate 57  Assessment/Plan  Intractable left frank pain with UTI and possible pyelonephritis  -UA: Few bacteria, 6-10 WBC, large hemoglobin, small leukocytes, positive nitrites -CT renal stone study: 13 mm nonobstructive calculus is seen in the inferior infundibulum of the left infrarenal collecting system.  Moderate left hydronephrosis with perinephric stranding without evidence of obstructing ureteral calculus, potentially this may be due to distal ureteral spasm from recently passed stone. -Will place on IV fluids, IV ceftriaxone and pain control with both oral and IV pain medication -Currently patient does not appear to be septic-currently afebrile with no leukocytosis.  Does have elevated lactic acid.  Will repeat in the morning. -Discussed with urology, Dr. Ronne Binning, and in inserting Foley catheter and starting patient on Flomax with outpatient follow-up.  Moderate left hydronephrosis -CT scan as noted above -Treatment and plan as above  Chronic kidney disease, stage III -Creatinine currently 1.8, upon review of patient's chart creatinine has ranged from 1.4-1.9 dating back to 2013 -Continue to monitor BMP  BPH -Noted on CT scan -Continue Proscar  Atrial fibrillation, chronic -Currently rate controlled -Continue metoprolol -Patient currently not on anticoagulation.  States cardiology told him to take aspirin.  Essential hypertension -Continue metoprolol and  Lasix -Will add on IV hydralazine as needed  Hyperlipidemia -Continue statin  DVT prophylaxis: Lovenox  Code Status: Full  Family Communication: None at bedside admission, patients condition and plan of care including tests being ordered have been discussed with the patient, who indicates understanding and agrees with the plan and Code Status.  Disposition Plan: Home  Consults called: Urology   Admission status: Observation   Time spent: 70 minutes  Crystalyn Delia D.O. [facility] Pager [phone]  If 7PM-7AM, please contact night-coverage [email or web address] Password [email or web address] 10/16/2017, 6:09 PM

## 2017-10-16 NOTE — ED Notes (Signed)
Pt O2 sat at ~86% while resting. Pt repositioned without resolution. Pt placed on O2 via [place] @ 3LPM. Pt sat returns to 95%.

## 2017-10-16 NOTE — ED Provider Notes (Signed)
[facility] EMERGENCY DEPARTMENT Provider Note   CSN: [id number] Arrival date & time: 10/16/17  1158     History   Chief Complaint Chief Complaint  Patient presents with  . Flank Pain    HPI Adam Buchanan. is a 83 y.o. male.  HPI 83 year old male with past medical history as below here with severe left flank pain.  Patient states his pain began gradually approximately 3 hours ago.  It then rapidly worsened and has been severe, aching, throbbing, cramping, along the left flank since then.  It radiates down towards his left groin.  Denies any overt testicular or penile pain.  He said associated nausea but no vomiting.  He said urinary frequency.  He does report that he had a kidney stone noticed on imaging in the past, but has never actually had or passed a kidney stone.  Denies any fevers or chills.  He was well prior to the onset of pain.  Pain is worse with movement and palpation.  No alleviating factors.  Past Medical History:  Diagnosis Date  . Atrial fibrillation (HCC) 02/2015  . BPH (benign prostatic hyperplasia)   . CAD (coronary artery disease) 08/09/2011   CAD, MI 07-2010, stents, then CABG 9-12  Cards -- f/u in [place]    . ELEVATED BLOOD PRESSURE WITHOUT DIAGNOSIS OF HYPERTENSION 05/19/2009   Qualifier: Diagnosis of  By: Drue Novel MD, Jose E.   . Hyperlipidemia   . Mitral regurgitation 2012   seen on echo    Patient Active Problem List   Diagnosis Date Noted  . UTI (urinary tract infection) 10/16/2017  . Paroxysmal atrial fibrillation (HCC) 02/17/2015  . Old MI (myocardial infarction) 02/17/2015  . Non-rheumatic mitral regurgitation 02/17/2015  . PCP NOTE >>>>> 01/18/2015  . Disease of the aorta (HCC) 06/20/2013  . Annual physical exam 06/08/2013  . Renal failure 02/10/2012  . CAD (coronary artery disease) 08/09/2011  . Hypertension 05/19/2009  . Hyperlipidemia 12/30/2007  . ERECTILE DYSFUNCTION 12/30/2007  . BPH (benign prostatic hyperplasia)  12/30/2007    Past Surgical History:  Procedure Laterality Date  . CARDIOVERSION  2012  . CORONARY ARTERY BYPASS GRAFT     (x5) 12-10-2010  . FACIAL FRACTURE SURGERY    . TONSILECTOMY, ADENOIDECTOMY, BILATERAL MYRINGOTOMY AND TUBES          Home Medications    Prior to Admission medications   Medication Sig Start Date End Date Taking? Authorizing Provider  atorvastatin (LIPITOR) 80 MG tablet Take 80 mg by mouth daily.   Yes [provider]  finasteride (PROSCAR) 5 MG tablet Take 5 mg by mouth daily.   Yes [provider]  lisinopril (PRINIVIL,ZESTRIL) 10 MG tablet Take 10 mg by mouth daily.   Yes [provider]  metoprolol succinate (TOPROL-XL) 25 MG 24 hr tablet Take 25 mg by mouth daily.   Yes [provider]  Multiple Vitamin (MULTIVITAMIN) tablet Take 1 tablet by mouth daily.   Yes [provider]  nitroGLYCERIN (NITROSTAT) 0.4 MG SL tablet Place 0.4 mg under the tongue every 5 (five) minutes as needed.   Yes [provider]  phenylephrine (NEO-SYNEPHRINE) 0.5 % nasal solution Place 1 drop into both nostrils every 6 (six) hours as needed for congestion.   Yes [provider]    Family History Family History  Problem Relation Age of Onset  . Hypertension Father   . Hypertension Maternal Grandmother   . Colon cancer Neg Hx   . Prostate  cancer Neg Hx     Social History Social History   Tobacco Use  . Smoking status: Former Games developer  . Smokeless tobacco: Never Used  . Tobacco comment: quit 1990s  Substance Use Topics  . Alcohol use: Yes    Comment: socially  . Drug use: No     Allergies   Patient has no known allergies.   Review of Systems Review of Systems  Constitutional: Positive for fatigue. Negative for chills and fever.  HENT: Negative for congestion and rhinorrhea.   Eyes: Negative for visual disturbance.  Respiratory: Negative for cough, shortness of breath and wheezing.     Cardiovascular: Negative for chest pain and leg swelling.  Gastrointestinal: Positive for nausea. Negative for abdominal pain, diarrhea and vomiting.  Genitourinary: Positive for frequency.  Musculoskeletal: Negative for neck pain and neck stiffness.  Skin: Negative for rash and wound.  Allergic/Immunologic: Negative for immunocompromised state.  Neurological: Negative for syncope, weakness and headaches.  All other systems reviewed and are negative.    Physical Exam Updated Vital Signs BP (!) 169/80   Pulse (!) 51   Temp 97.7 F (36.5 C) (Oral)   Resp 20   Wt 77.6 kg (171 lb)   SpO2 100%   BMI 23.19 kg/m   Physical Exam  Constitutional: He is oriented to person, place, and time. He appears well-developed and well-nourished. He appears distressed (Appears to be in pain).  HENT:  Head: Normocephalic and atraumatic.  Eyes: Conjunctivae are normal.  Neck: Neck supple.  Cardiovascular: Normal rate, regular rhythm and normal heart sounds. Exam reveals no friction rub.  No murmur heard. Pulmonary/Chest: Effort normal and breath sounds normal. No respiratory distress. He has no wheezes. He has no rales.  Abdominal: Soft. He exhibits no distension. There is no rebound and no guarding.  Moderate left upper and left lower quadrant tenderness  Genitourinary:  Genitourinary Comments: Testes descended bilaterally.  Hydrocele in the left testes, reportedly chronic.  Musculoskeletal: He exhibits no edema.  Neurological: He is alert and oriented to person, place, and time. He exhibits normal muscle tone.  Skin: Skin is warm. Capillary refill takes less than 2 seconds.  Psychiatric: He has a normal mood and affect.  Nursing note and vitals reviewed.    ED Treatments / Results  Labs (all labs ordered are listed, but only abnormal results are displayed) Labs Reviewed  URINALYSIS, ROUTINE W REFLEX MICROSCOPIC - Abnormal; Notable for the following components:      Result Value    APPearance HAZY (*)    Hgb urine dipstick LARGE (*)    Nitrite POSITIVE (*)    Leukocytes, UA SMALL (*)    Bacteria, UA FEW (*)    All other components within normal limits  COMPREHENSIVE METABOLIC PANEL - Abnormal; Notable for the following components:   CO2 21 (*)    Glucose, Bld 137 (*)    Creatinine, Ser 1.80 (*)    Calcium 10.6 (*)    Total Bilirubin 1.5 (*)    GFR calc non Af Amer 34 (*)    GFR calc Af Amer 39 (*)    All other components within normal limits  I-STAT CG4 LACTIC ACID, ED - Abnormal; Notable for the following components:   Lactic Acid, Venous 2.99 (*)    All other components within normal limits  URINE CULTURE  CULTURE, BLOOD (ROUTINE X 2)  CULTURE, BLOOD (ROUTINE X 2)  CBC WITH DIFFERENTIAL/PLATELET  LIPASE, BLOOD  I-STAT CG4 LACTIC ACID, ED  EKG EKG Interpretation  Date/Time:  Thursday October 16 2017 14:19:38 EDT Ventricular Rate:  57 PR Interval:    QRS Duration: 127 QT Interval:  485 QTC Calculation: 473 R Axis:   0 Text Interpretation:  Atrial flutter IVCD, consider atypical RBBB Inferior infarct, old No old tracing to compare Confirmed by Shaune Pollack [phone]) on 10/16/2017 3:01:35 PM   Radiology Ct Renal Stone Study  Result Date: 10/16/2017 CLINICAL DATA:  Acute left lower quadrant abdominal pain. EXAM: CT ABDOMEN AND PELVIS WITHOUT CONTRAST TECHNIQUE: Multidetector CT imaging of the abdomen and pelvis was performed following the standard protocol without IV contrast. COMPARISON:  CT scan of January 29, 2017. FINDINGS: Lower chest: No acute abnormality. Hepatobiliary: Mild cholelithiasis is noted without inflammation. No biliary dilatation is noted. No abnormality is seen in the liver on these unenhanced images. Pancreas: Unremarkable. No pancreatic ductal dilatation or surrounding inflammatory changes. Spleen: Normal in size without focal abnormality. Adrenals/Urinary Tract: Adrenal glands appear normal. Stable right renal cyst is noted. 13 mm  calculus is seen in left lower infundibulum. Moderate left hydroureteronephrosis is noted with perinephric stranding without obstructing calculus. Urinary bladder is unremarkable. Stomach/Bowel: Stomach is within normal limits. Appendix appears normal. No evidence of bowel wall thickening, distention, or inflammatory changes. Vascular/Lymphatic: Aortic atherosclerosis. No enlarged abdominal or pelvic lymph nodes. Reproductive: Stable severe prostatic enlargement is noted. Other: No abdominal wall hernia or abnormality. No abdominopelvic ascites. Musculoskeletal: Multilevel degenerative disc disease is noted in the lumbar spine. No acute osseous abnormality is noted. IMPRESSION: 13 mm nonobstructive calculus is seen in inferior infundibulum of left intrarenal collecting system. Moderate left hydronephrosis is noted with perinephric stranding without evidence of obstructing ureteral calculus. Potentially this may be due to distal ureteral spasm from recently passed stone. Mild cholelithiasis is noted without inflammation. Stable severe prostatic enlargement is noted. Multilevel degenerative disc disease is noted in the lumbar spine. Aortic Atherosclerosis (ICD10-I70.0). Electronically Signed   By: Lupita Raider, M.D.   On: 10/16/2017 16:45    Procedures Procedures (including critical care time)  Medications Ordered in ED Medications  morphine 4 MG/ML injection 4 mg (4 mg Intravenous Given 10/16/17 1255)  ondansetron (ZOFRAN) injection 4 mg (4 mg Intravenous Given 10/16/17 1255)  sodium chloride 0.9 % bolus 1,000 mL ( Intravenous Stopped 10/16/17 1345)  morphine 4 MG/ML injection 4 mg (4 mg Intravenous Given 10/16/17 1415)  cefTRIAXone (ROCEPHIN) 2 g in sodium chloride 0.9 % 100 mL IVPB (0 g Intravenous Stopped 10/16/17 1733)  sodium chloride 0.9 % bolus 1,000 mL (1,000 mLs Intravenous New Bag/Given 10/16/17 1644)  morphine 4 MG/ML injection 4 mg (4 mg Intravenous Given 10/16/17 1730)     Initial  Impression / Assessment and Plan / ED Course  I have reviewed the triage vital signs and the nursing notes.  Pertinent labs & imaging results that were available during my care of the patient were reviewed by me and considered in my medical decision making (see chart for details).     83 yo M here with severe L flank pain, acute onset. Imaging is consistent with passed stone versus pyelo. Labs show significant lactic acidosis but normal WBC. UA is concerning for UTI. Will treat as pyelo, no fever/hypotension or other signs of sepsis. Admit for pain control. Given large stone in L kidney, discussed with Dr. Ronne Binning of Urology who is in agreement - no intervention needed at this time.  Final Clinical Impressions(s) / ED Diagnoses   Final diagnoses:  Pyelonephritis  Kidney stone on left side    ED Discharge Orders    None       Shaune PollackIsaacs, Destynie Toomey, MD 10/16/17 1836

## 2017-10-16 NOTE — Progress Notes (Signed)
Received patient from ED, AOx4, VSS but elevated BP at 167/81 d/t pain at 7/10, refused foley catheter insertion and stated thart he can still urinate and requested for urinal, oriented to room, bed controls and call light.  Administered PRN pain medication and scheduled medication.  Will monitor.

## 2017-10-16 NOTE — ED Triage Notes (Signed)
Pt has LLQ pain going to his groin, rates 8/10, endorses nausea. Pt recently diagnosed here with kidney stone here. 164/90, 48 HR (his normal), 98% room air, CBG 173.

## 2017-10-16 NOTE — ED Notes (Signed)
Attempted report x1. 

## 2017-10-16 NOTE — ED Notes (Signed)
Called lab to add on urine culture ?

## 2017-10-17 ENCOUNTER — Other Ambulatory Visit: Payer: Self-pay

## 2017-10-17 DIAGNOSIS — N19 Unspecified kidney failure: Secondary | ICD-10-CM | POA: Diagnosis not present

## 2017-10-17 DIAGNOSIS — N400 Enlarged prostate without lower urinary tract symptoms: Secondary | ICD-10-CM | POA: Diagnosis not present

## 2017-10-17 DIAGNOSIS — N136 Pyonephrosis: Secondary | ICD-10-CM | POA: Diagnosis not present

## 2017-10-17 DIAGNOSIS — E785 Hyperlipidemia, unspecified: Secondary | ICD-10-CM | POA: Diagnosis not present

## 2017-10-17 DIAGNOSIS — R319 Hematuria, unspecified: Secondary | ICD-10-CM | POA: Diagnosis not present

## 2017-10-17 DIAGNOSIS — I10 Essential (primary) hypertension: Secondary | ICD-10-CM | POA: Diagnosis not present

## 2017-10-17 DIAGNOSIS — N390 Urinary tract infection, site not specified: Secondary | ICD-10-CM | POA: Diagnosis not present

## 2017-10-17 LAB — BASIC METABOLIC PANEL
Anion gap: 11 (ref 5–15)
BUN: 22 mg/dL (ref 8–23)
CHLORIDE: 108 mmol/L (ref 98–111)
CO2: 23 mmol/L (ref 22–32)
CREATININE: 1.72 mg/dL — AB (ref 0.61–1.24)
Calcium: 9.1 mg/dL (ref 8.9–10.3)
GFR calc non Af Amer: 36 mL/min — ABNORMAL LOW (ref >60)
GFR, EST AFRICAN AMERICAN: 41 mL/min — AB (ref >60)
Glucose, Bld: 149 mg/dL — ABNORMAL HIGH (ref 70–99)
POTASSIUM: 4.4 mmol/L (ref 3.5–5.1)
SODIUM: 142 mmol/L (ref 135–145)

## 2017-10-17 LAB — BLOOD CULTURE ID PANEL (REFLEXED)
ACINETOBACTER BAUMANNII: NOT DETECTED
Candida albicans: NOT DETECTED
Candida glabrata: NOT DETECTED
Candida krusei: NOT DETECTED
Candida parapsilosis: NOT DETECTED
Candida tropicalis: NOT DETECTED
ENTEROCOCCUS SPECIES: NOT DETECTED
ESCHERICHIA COLI: NOT DETECTED
Enterobacter cloacae complex: NOT DETECTED
Enterobacteriaceae species: NOT DETECTED
HAEMOPHILUS INFLUENZAE: NOT DETECTED
Klebsiella oxytoca: NOT DETECTED
Klebsiella pneumoniae: NOT DETECTED
LISTERIA MONOCYTOGENES: NOT DETECTED
METHICILLIN RESISTANCE: NOT DETECTED
NEISSERIA MENINGITIDIS: NOT DETECTED
PSEUDOMONAS AERUGINOSA: NOT DETECTED
Proteus species: NOT DETECTED
STREPTOCOCCUS AGALACTIAE: NOT DETECTED
STREPTOCOCCUS PNEUMONIAE: NOT DETECTED
Serratia marcescens: NOT DETECTED
Staphylococcus aureus (BCID): NOT DETECTED
Staphylococcus species: DETECTED — AB
Streptococcus pyogenes: NOT DETECTED
Streptococcus species: NOT DETECTED

## 2017-10-17 LAB — CBC
HEMATOCRIT: 40.4 % (ref 39.0–52.0)
Hemoglobin: 12.8 g/dL — ABNORMAL LOW (ref 13.0–17.0)
MCH: 29.8 pg (ref 26.0–34.0)
MCHC: 31.7 g/dL (ref 30.0–36.0)
MCV: 94 fL (ref 78.0–100.0)
PLATELETS: 117 10*3/uL — AB (ref 150–400)
RBC: 4.3 MIL/uL (ref 4.22–5.81)
RDW: 13.8 % (ref 11.5–15.5)
WBC: 11.4 10*3/uL — AB (ref 4.0–10.5)

## 2017-10-17 MED ORDER — TAMSULOSIN HCL 0.4 MG PO CAPS: 0.4000 mg | ORAL_CAPSULE | Freq: Every day | ORAL | 0 refills | Status: DC

## 2017-10-17 MED ORDER — ENOXAPARIN SODIUM 40 MG/0.4ML ~~LOC~~ SOLN: 40.0000 mg | SUBCUTANEOUS | Status: DC

## 2017-10-17 MED ORDER — CEFUROXIME AXETIL 500 MG PO TABS: 500.0000 mg | ORAL_TABLET | Freq: Two times a day (BID) | ORAL | 0 refills | Status: AC

## 2017-10-17 MED ORDER — ASPIRIN 81 MG PO TBEC: 81.0000 mg | DELAYED_RELEASE_TABLET | Freq: Every day | ORAL | Status: DC

## 2017-10-17 MED ORDER — HYDROCODONE-ACETAMINOPHEN 5-325 MG PO TABS
1.0000 | ORAL_TABLET | Freq: Four times a day (QID) | ORAL | 0 refills | Status: AC | PRN
Start: 2017-10-17 — End: 2017-10-22

## 2017-10-17 NOTE — Discharge Instructions (Signed)
Urinary Tract Infection, Adult  A urinary tract infection (UTI) is an infection of any part of the urinary tract, which includes the kidneys, ureters, bladder, and urethra. These organs make, store, and get rid of urine in the body. UTI can be a bladder infection (cystitis) or kidney infection (pyelonephritis).  What are the causes?  This infection may be caused by fungi, viruses, or bacteria. Bacteria are the most common cause of UTIs. This condition can also be caused by repeated incomplete emptying of the bladder during urination.  What increases the risk?  This condition is more likely to develop if:   You ignore your need to urinate or hold urine for long periods of time.   You do not empty your bladder completely during urination.   You wipe back to front after urinating or having a bowel movement, if you are male.   You are uncircumcised, if you are male.   You are constipated.   You have a urinary catheter that stays in place (indwelling).   You have a weak defense (immune) system.   You have a medical condition that affects your bowels, kidneys, or bladder.   You have diabetes.   You take antibiotic medicines frequently or for long periods of time, and the antibiotics no longer work well against certain types of infections (antibiotic resistance).   You take medicines that irritate your urinary tract.   You are exposed to chemicals that irritate your urinary tract.   You are male.    What are the signs or symptoms?  Symptoms of this condition include:   Fever.   Frequent urination or passing small amounts of urine frequently.   Needing to urinate urgently.   Pain or burning with urination.   Urine that smells bad or unusual.   Cloudy urine.   Pain in the lower abdomen or back.   Trouble urinating.   Blood in the urine.   Vomiting or being less hungry than normal.   Diarrhea or abdominal pain.   Vaginal discharge, if you are male.    How is this diagnosed?  This condition is  diagnosed with a medical history and physical exam. You will also need to provide a urine sample to test your urine. Other tests may be done, including:   Blood tests.   Sexually transmitted disease (STD) testing.    If you have had more than one UTI, a cystoscopy or imaging studies may be done to determine the cause of the infections.  How is this treated?  Treatment for this condition often includes a combination of two or more of the following:   Antibiotic medicine.   Other medicines to treat less common causes of UTI.   Over-the-counter medicines to treat pain.   Drinking enough water to stay hydrated.    Follow these instructions at home:   Take over-the-counter and prescription medicines only as told by your health care provider.   If you were prescribed an antibiotic, take it as told by your health care provider. Do not stop taking the antibiotic even if you start to feel better.   Avoid alcohol, caffeine, tea, and carbonated beverages. They can irritate your bladder.   Drink enough fluid to keep your urine clear or pale yellow.   Keep all follow-up visits as told by your health care provider. This is important.   Make sure to:  ? Empty your bladder often and completely. Do not hold urine for long periods of time.  ?   Empty your bladder before and after sex.  ? Wipe from front to back after a bowel movement if you are male. Use each tissue one time when you wipe.  Contact a health care provider if:   You have back pain.   You have a fever.   You feel nauseous or vomit.   Your symptoms do not get better after 3 days.   Your symptoms go away and then return.  Get help right away if:   You have severe back pain or lower abdominal pain.   You are vomiting and cannot keep down any medicines or water.  This information is not intended to replace advice given to you by your health care provider. Make sure you discuss any questions you have with your health care provider.  Document Released:  12/19/2004 Document Revised: 08/23/2015 Document Reviewed: 01/30/2015  [employer] Interactive Patient Education  2018 [employer]

## 2017-10-17 NOTE — Discharge Summary (Signed)
Physician Discharge Summary  Adam Buchanan. ZOX:[id number] DOB: 03-22-1935 DOA: 10/16/2017  PCP: Wanda Plump, MD  Admit date: 10/16/2017 Discharge date: 10/17/2017  Time spent: 45 minutes  Recommendations for Outpatient Follow-up:  Patient will be discharged to home.  Patient will need to follow up with primary care provider within one week of discharge. Follow up with urology, Dr. Mena Goes, in on week. Patient should continue medications as prescribed.  Patient should follow a heart healthy diet.   Discharge Diagnoses:  Intractable left frank pain with UTI and possible pyelonephritis  Moderate left hydronephrosis Chronic kidney disease, stage III BPH Atrial fibrillation, chronic Essential hypertension Hyperlipidemia  Discharge Condition: Stable  Diet recommendation: heart healthy  Filed Weights   10/16/17 1211 10/17/17 0127  Weight: 77.6 kg (171 lb) 77.7 kg (171 lb 4.8 oz)    History of present illness:  On 10/16/2017  Adam Buchanan. is a 83 y.o. male with a medical history of atrial fibrillation, BPH, hypertension, hyperlipidemia presented to the emergency department with complaints of sudden onset of left sided flank pain.  Patient states the pain started today and has been excruciating, states it is 10 out of 10, with cramping aching and throbbing pain.  The pain worsens with any type of movement.  Recently received some pain medications in the ED therefore states pain has improved mildly.  Also reported some nausea without vomiting.  He also complains of some increased urinary frequency.  He does not feel that he has passed a kidney stone.  Patient states he does follow-up with urology and recently saw them last week for his prostate.  Currently he denies any chest pain, shortness of breath, abdominal pain, diarrhea or constipation, dizziness or headache, recent travel or sick contacts.  Hospital Course:  Intractable left frank pain with UTI and possible pyelonephritis    -UA: Few bacteria, 6-10 WBC, large hemoglobin, small leukocytes, positive nitrites -CT renal stone study: 13 mm nonobstructive calculus is seen in the inferior infundibulum of the left infrarenal collecting system.  Moderate left hydronephrosis with perinephric stranding without evidence of obstructing ureteral calculus, potentially this may be due to distal ureteral spasm from recently passed stone. -Placed on IV fluids, IV ceftriaxone and pain control with both oral and IV pain medication -Currently patient does not appear to be septic-currently afebrile with no leukocytosis. -Blood cultures show no growth to date -urine culture pending  -Discussed with urology, Dr. Ronne Binning, and in inserting Foley catheter and starting patient on Flomax with outpatient follow-up. -Patient refused foley catheter placement. Discussed with Dr. Mena Goes, agreed with leaving out the foley and will follow up with patient in one week. -Feels pain has improved -will discharge with ceftin 500mg  BID and limited amount of pain medication  Moderate left hydronephrosis -CT scan as noted above -Treatment and plan as above  Chronic kidney disease, stage III -Creatinine currently 1.72, upon review of patient's chart creatinine has ranged from 1.4-1.9 dating back to 2013 -Continue to monitor BMP  BPH -Noted on CT scan -Continue Proscar  Atrial fibrillation, chronic -Currently rate controlled -Continue metoprolol -Patient currently not on anticoagulation.  States cardiology told him to take aspirin.  Essential hypertension -Continue metoprolol and Lasix  Hyperlipidemia -Continue statin  Procedures: None  Consultations: Urology, via phone  Discharge Exam: Vitals:   10/16/17 2007 10/17/17 0520  BP: (!) 167/81 (!) 99/58  Pulse: (!) 58 68  Resp: 17 18  Temp: 98.4 F (36.9 C) 97.7 F (36.5 C)  SpO2:  99% 100%   Patient states his pain has resolved and he is feeling better.  Does not feel that he  needs a Foley catheter as he is able to urinate on his own.  Currently denies chest pain, shortness breath, abdominal pain, nausea vomiting, diarrhea or constipation, dizziness or headache.   General: Well developed, well nourished, NAD, appears stated age  HEENT: NCAT,mucous membranes moist.  Neck: Supple   Cardiovascular: S1 S2 auscultated, irregular  Respiratory: Clear to auscultation bilaterally with equal chest rise  Abdomen: Soft, nontender, nondistended, + bowel sounds  Extremities: warm dry without cyanosis clubbing. Chronic LE edema R>L  Neuro: AAOx3, nonfocal  Skin: Without rashes exudates or nodules  Psych: Normal affect and demeanor with intact judgement and insight  Discharge Instructions Discharge Instructions    Discharge instructions   Complete by:  As directed    Patient will be discharged to home.  Patient will need to follow up with primary care provider within one week of discharge. Follow up with urology, Dr. Mena Goes, in on week. Patient should continue medications as prescribed.  Patient should follow a heart healthy diet.     Allergies as of 10/17/2017   No Known Allergies     Medication List    TAKE these medications   aspirin 81 MG EC tablet Take 1 tablet (81 mg total) by mouth daily. Start taking on:  10/18/2017   atorvastatin 80 MG tablet Commonly known as:  LIPITOR Take 80 mg by mouth daily.   cefUROXime 500 MG tablet Commonly known as:  CEFTIN Take 1 tablet (500 mg total) by mouth 2 (two) times daily for 10 days.   finasteride 5 MG tablet Commonly known as:  PROSCAR Take 5 mg by mouth daily.   HYDROcodone-acetaminophen 5-325 MG tablet Commonly known as:  NORCO/VICODIN Take 1 tablet by mouth every 6 (six) hours as needed for up to 5 days for moderate pain.   lisinopril 10 MG tablet Commonly known as:  PRINIVIL,ZESTRIL Take 10 mg by mouth daily.   metoprolol succinate 25 MG 24 hr tablet Commonly known as:  TOPROL-XL Take 25 mg  by mouth daily.   multivitamin tablet Take 1 tablet by mouth daily.   nitroGLYCERIN 0.4 MG SL tablet Commonly known as:  NITROSTAT Place 0.4 mg under the tongue every 5 (five) minutes as needed.   phenylephrine 0.5 % nasal solution Commonly known as:  NEO-SYNEPHRINE Place 1 drop into both nostrils every 6 (six) hours as needed for congestion.   tamsulosin 0.4 MG Caps capsule Commonly known as:  FLOMAX Take 1 capsule (0.4 mg total) by mouth daily. Start taking on:  10/18/2017      No Known Allergies Follow-up Information    Wanda Plump, MD. Schedule an appointment as soon as possible for a visit in 1 week(s).   Specialty:  Internal Medicine Why:  Hospital follow up Contact information: [address] [place] [address] [phone]        Jerilee Field, MD. Schedule an appointment as soon as possible for a visit in 1 week(s).   Specialty:  Urology Why:  Hospital follow up Contact information: [address] [place] [address] [phone]            The results of significant diagnostics from this hospitalization (including imaging, microbiology, ancillary and laboratory) are listed below for reference.    Significant Diagnostic Studies: Ct Renal Stone Study  Result Date: 10/16/2017 CLINICAL DATA:  Acute left  lower quadrant abdominal pain. EXAM: CT ABDOMEN AND PELVIS WITHOUT CONTRAST TECHNIQUE: Multidetector CT imaging of the abdomen and pelvis was performed following the standard protocol without IV contrast. COMPARISON:  CT scan of January 29, 2017. FINDINGS: Lower chest: No acute abnormality. Hepatobiliary: Mild cholelithiasis is noted without inflammation. No biliary dilatation is noted. No abnormality is seen in the liver on these unenhanced images. Pancreas: Unremarkable. No pancreatic ductal dilatation or surrounding inflammatory changes. Spleen: Normal in size without focal abnormality. Adrenals/Urinary Tract: Adrenal glands appear  normal. Stable right renal cyst is noted. 13 mm calculus is seen in left lower infundibulum. Moderate left hydroureteronephrosis is noted with perinephric stranding without obstructing calculus. Urinary bladder is unremarkable. Stomach/Bowel: Stomach is within normal limits. Appendix appears normal. No evidence of bowel wall thickening, distention, or inflammatory changes. Vascular/Lymphatic: Aortic atherosclerosis. No enlarged abdominal or pelvic lymph nodes. Reproductive: Stable severe prostatic enlargement is noted. Other: No abdominal wall hernia or abnormality. No abdominopelvic ascites. Musculoskeletal: Multilevel degenerative disc disease is noted in the lumbar spine. No acute osseous abnormality is noted. IMPRESSION: 13 mm nonobstructive calculus is seen in inferior infundibulum of left intrarenal collecting system. Moderate left hydronephrosis is noted with perinephric stranding without evidence of obstructing ureteral calculus. Potentially this may be due to distal ureteral spasm from recently passed stone. Mild cholelithiasis is noted without inflammation. Stable severe prostatic enlargement is noted. Multilevel degenerative disc disease is noted in the lumbar spine. Aortic Atherosclerosis (ICD10-I70.0). Electronically Signed   By: Lupita RaiderJames  Green Jr, M.D.   On: 10/16/2017 16:45    Microbiology: Recent Results (from the past 240 hour(s))  Blood culture (routine x 2)     Status: None (Preliminary result)   Collection Time: 10/16/17  4:35 PM  Result Value Ref Range Status   Specimen Description BLOOD LEFT ANTECUBITAL  Final   Special Requests   Final    BOTTLES DRAWN AEROBIC AND ANAEROBIC Blood Culture results may not be optimal due to an inadequate volume of blood received in culture bottles   Culture   Final    NO GROWTH < 24 HOURS Performed at [facility], [address] [place], [place] [address]    Report Status PENDING  Incomplete  Blood culture (routine x 2)     Status: None  (Preliminary result)   Collection Time: 10/16/17  4:35 PM  Result Value Ref Range Status   Specimen Description BLOOD RIGHT ANTECUBITAL  Final   Special Requests   Final    BOTTLES DRAWN AEROBIC AND ANAEROBIC Blood Culture results may not be optimal due to an inadequate volume of blood received in culture bottles   Culture   Final    NO GROWTH < 24 HOURS Performed at [facility], [address] [place], [place] [address]    Report Status PENDING  Incomplete     Labs: Basic Metabolic Panel: Recent Labs  Lab 10/13/17 0937 10/16/17 1220 10/17/17 0505  NA 141 141 142  K 4.2 4.3 4.4  CL 107 108 108  CO2 25 21* 23  GLUCOSE 91 137* 149*  BUN 22 18 22   CREATININE 1.58* 1.80* 1.72*  CALCIUM 9.8 10.6* 9.1   Liver Function Tests: Recent Labs  Lab 10/16/17 1220  AST 40  ALT 44  ALKPHOS 63  BILITOT 1.5*  PROT 7.5  ALBUMIN 4.3   Recent Labs  Lab 10/16/17 1220  LIPASE 46   No results for input(s): AMMONIA in the last 168 hours. CBC: Recent Labs  Lab 10/16/17 1220 10/17/17 0505  WBC 8.2 11.4*  NEUTROABS 6.5  --   HGB 15.9 12.8*  HCT 48.8 40.4  MCV 92.2 94.0  PLT 156 117*   Cardiac Enzymes: No results for input(s): CKTOTAL, CKMB, CKMBINDEX, TROPONINI in the last 168 hours. BNP: BNP (last 3 results) No results for input(s): BNP in the last 8760 hours.  ProBNP (last 3 results) No results for input(s): PROBNP in the last 8760 hours.  CBG: No results for input(s): GLUCAP in the last 168 hours.     Signed:  Edsel Petrin  [facility] 10/17/2017, 12:35 PM

## 2017-10-18 LAB — URINE CULTURE

## 2017-10-19 ENCOUNTER — Telehealth: Payer: Self-pay | Admitting: Internal Medicine

## 2017-10-19 NOTE — Telephone Encounter (Signed)
Needs TCM

## 2017-10-20 ENCOUNTER — Telehealth: Payer: Self-pay

## 2017-10-20 LAB — CULTURE, BLOOD (ROUTINE X 2)

## 2017-10-20 NOTE — Telephone Encounter (Signed)
Calling patient today to complete TCM.

## 2017-10-20 NOTE — Telephone Encounter (Signed)
10/20/17   Transition Care Management Follow-up Telephone Call  ADMISSION DATE: 10/16/17  DISCHARGE DATE: 10/17/17   How have you been since you were released from the hospital? Patient states he is doing fine.   Do you understand why you were in the hospital? Yes   Do you understand the discharge instrcutions? Yes  Items Reviewed:  Medications reviewed: Yes   Allergies reviewed: NKDA   Dietary changes reviewed: Heart healthy continued per patient.   Referrals reviewed: Appointment scheduled for Dr. Drue NovelPaz. Patient has Urology follow up as well.   Functional Questionnaire:   Activities of Daily Living (ADLs): Patient able to perform all independently.  Any patient concerns? None at this time.   Confirmed importance and date/time of follow-up visits scheduled: Yes   Confirmed with patient if condition begins to worsen call PCP or go to the ER. Yes    Patient was given the office number and encouragred to call back with questions or concerns. Yes

## 2017-10-20 NOTE — Telephone Encounter (Signed)
Error

## 2017-10-22 ENCOUNTER — Inpatient Hospital Stay: Payer: Medicare Other | Admitting: Internal Medicine

## 2017-10-22 DIAGNOSIS — N200 Calculus of kidney: Secondary | ICD-10-CM | POA: Diagnosis not present

## 2017-10-22 DIAGNOSIS — N400 Enlarged prostate without lower urinary tract symptoms: Secondary | ICD-10-CM | POA: Diagnosis not present

## 2017-10-22 LAB — CULTURE, BLOOD (ROUTINE X 2)

## 2017-10-28 ENCOUNTER — Ambulatory Visit (INDEPENDENT_AMBULATORY_CARE_PROVIDER_SITE_OTHER): Payer: Medicare Other | Admitting: Internal Medicine

## 2017-10-28 ENCOUNTER — Encounter: Payer: Self-pay | Admitting: Internal Medicine

## 2017-10-28 VITALS — BP 122/78 | HR 69 | Temp 97.8°F | Resp 16 | Ht 72.0 in | Wt 168.2 lb

## 2017-10-28 DIAGNOSIS — I480 Paroxysmal atrial fibrillation: Secondary | ICD-10-CM | POA: Diagnosis not present

## 2017-10-28 DIAGNOSIS — N200 Calculus of kidney: Secondary | ICD-10-CM

## 2017-10-28 LAB — BASIC METABOLIC PANEL
BUN: 20 mg/dL (ref 6–23)
CHLORIDE: 107 meq/L (ref 96–112)
CO2: 28 mEq/L (ref 19–32)
CREATININE: 1.29 mg/dL (ref 0.40–1.50)
Calcium: 10 mg/dL (ref 8.4–10.5)
GFR: 56.81 mL/min — ABNORMAL LOW (ref >60.00)
Glucose, Bld: 93 mg/dL (ref 70–99)
POTASSIUM: 4.4 meq/L (ref 3.5–5.1)
SODIUM: 141 meq/L (ref 135–145)

## 2017-10-28 LAB — CBC WITH DIFFERENTIAL/PLATELET
Basophils Absolute: 0 10*3/uL (ref 0.0–0.1)
Basophils Relative: 0.8 % (ref 0.0–3.0)
EOS PCT: 1.7 % (ref 0.0–5.0)
Eosinophils Absolute: 0.1 10*3/uL (ref 0.0–0.7)
HEMATOCRIT: 42 % (ref 39.0–52.0)
HEMOGLOBIN: 14.1 g/dL (ref 13.0–17.0)
LYMPHS PCT: 25.1 % (ref 12.0–46.0)
Lymphs Abs: 1.3 10*3/uL (ref 0.7–4.0)
MCHC: 33.6 g/dL (ref 30.0–36.0)
MCV: 90 fl (ref 78.0–100.0)
MONOS PCT: 7.9 % (ref 3.0–12.0)
Monocytes Absolute: 0.4 10*3/uL (ref 0.1–1.0)
Neutro Abs: 3.4 10*3/uL (ref 1.4–7.7)
Neutrophils Relative %: 64.5 % (ref 43.0–77.0)
Platelets: 194 10*3/uL (ref 150.0–400.0)
RBC: 4.66 Mil/uL (ref 4.22–5.81)
RDW: 14 % (ref 11.5–15.5)
WBC: 5.3 10*3/uL (ref 4.0–10.5)

## 2017-10-28 MED ORDER — APIXABAN 2.5 MG PO TABS: 2.5000 mg | ORAL_TABLET | Freq: Two times a day (BID) | ORAL | 1 refills | Status: DC

## 2017-10-28 NOTE — Progress Notes (Signed)
Subjective:    Patient ID: Adam Buchanan., male    DOB: [birth year], 83 y.o.   MRN: [id number]  DOS:  10/28/2017 Type of visit - description : Hospital follow-up, TCM 14 Interval history: Went to the ER with sudden onset of left flank pain, severe, had some nausea and vomiting. Was admitted to the hospital, discharge 10/17/2017. UA show bacteria and few leukocytes, CT show a 13 mm nonobstructive stone on the left. Moderate left hydronephrosis with perinephric stranding possibly due to a recent passage of a stone. Urology consulted, they recommended to insert Foley catheter and start Flomax.  Patient refused Foley catheter,  Was rec  to see urology on follow-up soon. Was discharged on Ceftin and pain medication. Here for follow-up  Review of Systems Since he left the hospital, he took the antibiotics as prescribed. No further pain. Denies fever chills Denies chest pain, difficulty breathing or palpitations. Lower extremity edema is at baseline. He was already contacted by urology.   Past Medical History:  Diagnosis Date  . Atrial fibrillation (HCC) 02/2015  . BPH (benign prostatic hyperplasia)   . CAD (coronary artery disease) 08/09/2011   CAD, MI 07-2010, stents, then CABG 9-12  Cards -- f/u in [place]    . ELEVATED BLOOD PRESSURE WITHOUT DIAGNOSIS OF HYPERTENSION 05/19/2009   Qualifier: Diagnosis of  By: Drue NovelPaz MD, Rani Sisney E.   . Hyperlipidemia   . Mitral regurgitation 2012   seen on echo    Past Surgical History:  Procedure Laterality Date  . CARDIOVERSION  2012  . CORONARY ARTERY BYPASS GRAFT     (x5) 12-10-2010  . FACIAL FRACTURE SURGERY    . TONSILECTOMY, ADENOIDECTOMY, BILATERAL MYRINGOTOMY AND TUBES      Social History   Socioeconomic History  . Marital status: Married    Spouse name: Not on file  . Number of children: 2  . Years of education: Not on file  . Highest education level: Not on file  Occupational History  . Occupation: retired  Engineer, productionocial Needs  . Financial  resource strain: Not on file  . Food insecurity:    Worry: Not on file    Inability: Not on file  . Transportation needs:    Medical: Not on file    Non-medical: Not on file  Tobacco Use  . Smoking status: Former Games developermoker  . Smokeless tobacco: Never Used  . Tobacco comment: quit 1990s  Substance and Sexual Activity  . Alcohol use: Yes    Comment: socially  . Drug use: No  . Sexual activity: Not on file  Lifestyle  . Physical activity:    Days per week: Not on file    Minutes per session: Not on file  . Stress: Not on file  Relationships  . Social connections:    Talks on phone: Not on file    Gets together: Not on file    Attends religious service: Not on file    Active member of club or organization: Not on file    Attends meetings of clubs or organizations: Not on file    Relationship status: Not on file  . Intimate partner violence:    Fear of current or ex partner: Not on file    Emotionally abused: Not on file    Physically abused: Not on file    Forced sexual activity: Not on file  Other Topics Concern  . Not on file  Social History Narrative   Married , household-- pt, wife, disabled son (  DM-amputee)       Allergies as of 10/28/2017   No Known Allergies     Medication List        Accurate as of 10/28/17 10:13 AM. Always use your most recent med list.          aspirin 81 MG EC tablet Take 1 tablet (81 mg total) by mouth daily.   atorvastatin 80 MG tablet Commonly known as:  LIPITOR Take 80 mg by mouth daily.   finasteride 5 MG tablet Commonly known as:  PROSCAR Take 5 mg by mouth daily.   lisinopril 10 MG tablet Commonly known as:  PRINIVIL,ZESTRIL Take 10 mg by mouth daily.   metoprolol succinate 25 MG 24 hr tablet Commonly known as:  TOPROL-XL Take 25 mg by mouth daily.   multivitamin tablet Take 1 tablet by mouth daily.   nitroGLYCERIN 0.4 MG SL tablet Commonly known as:  NITROSTAT Place 0.4 mg under the tongue every 5 (five) minutes as  needed.   phenylephrine 0.5 % nasal solution Commonly known as:  NEO-SYNEPHRINE Place 1 drop into both nostrils every 6 (six) hours as needed for congestion.   tamsulosin 0.4 MG Caps capsule Commonly known as:  FLOMAX Take 1 capsule (0.4 mg total) by mouth daily.          Objective:   Physical Exam BP 122/78 (BP Location: Left Arm, Patient Position: Sitting, Cuff Size: Small)   Pulse 69   Temp 97.8 F (36.6 C) (Oral)   Resp 16   Ht 6' (1.829 m)   Wt 168 lb 4 oz (76.3 kg)   SpO2 97%   BMI 22.82 kg/m   General:   Well developed, NAD, see BMI.  HEENT:  Normocephalic . Face symmetric, atraumatic Lungs:  CTA B Normal respiratory effort, no intercostal retractions, no accessory muscle use. Heart: Seems regular on clinical exam,  no murmur.  +/+++ pretibial edema R>L at baseline Abdomen:  Not distended, soft, non-tender. No rebound or rigidity.  No CVA tenderness Skin: Not pale. Not jaundice Neurologic:  alert & oriented X3.  Speech normal, gait appropriate for age and unassisted Psych--  Cognition and judgment appear intact.  Cooperative with normal attention span and concentration.  Behavior appropriate. No anxious or depressed appearing.     Assessment & Plan:  Assessment HTN Hyperlipidemia CRI  creat ~ 1.6,  US renal 2015 no obstruction  CV:  Cardiology @ [place] CAD: MI, stents 07-2010. CABG 11-2010. Atrial fib -- cardioversion 2012, on amiodarone  MR per echo  11-2010, no murmur, no need to repeat echo per cardiology note GU: --BPH, Elevated PSA reportedly had a bx Dr Vonita Moss 2012 , 05-2013 PSA 12.11 -Hematuria, CT and cystoscopy late  2018, both ok, +urolithiasis( Dr. Mena Goes).  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  Plan: Urolithiasis: Recently went to the hospital with acute left flank pain, CT showed 13 mm nonobstructing calculus of the left collecting system and moderate  hydronephrosis consistent with recent passage of stone.  Since he left the hospital is doing well, no more pain, no fever chills, status post antibiotics. Was contacted by urology and apparently they are planning to do a procedure (lithotripsy?).  We will check a BMP and CBC.  Because of the possible upcoming procedure, the patient is holding aspirin see next. Paroxysmal A. fib: Prior to the hospital admission he was on sinus rhythm for years and only on aspirin  however his EKG in the hospital showed A flutter. EKG today is confirmatory. Will asked patient to restart aspirin, will contact cardiology and if you know he is on A. Fib. Addendum: I spoke with the cardiologist covering for Dr. Reuel Boom, given A. fib, age, hypertension he qualifies for anticoagulation, thus recommend  Eliquis.  (will rx 2.5 mg twice daily d/t baseline creat > 1.5  and age).  I spoke with the patient, explained the risk of atrial fibrillation and strokes, explained the risks of taking anticoagulants and sx of bleeding.  For now, will stay on low-dose aspirin given CAD.   Needs prompt follow-up with cardiology, he is to contact the office if he is not call soon. RTC 2 months

## 2017-10-28 NOTE — Patient Instructions (Signed)
GO TO THE LAB : Get the blood work     GO TO THE FRONT DESK Schedule your next appointment for a checkup in 2 months  Please go back on aspirin  Please contact your heart doctor regards to atrial fibrillation.

## 2017-10-28 NOTE — Assessment & Plan Note (Signed)
Plan: Urolithiasis: Recently went to the hospital with acute left flank pain, CT showed 13 mm nonobstructing calculus of the left collecting system and moderate hydronephrosis consistent with recent passage of stone.  Since he left the hospital is doing well, no more pain, no fever chills, status post antibiotics. Was contacted by urology and apparently they are planning to do a procedure (lithotripsy?).  We will check a BMP and CBC.  Because of the possible upcoming procedure, the patient is holding aspirin see next. Paroxysmal A. fib: Prior to the hospital admission he was on sinus rhythm for years and only on aspirin however his EKG in the hospital showed A flutter. EKG today is confirmatory. Will asked patient to restart aspirin, will contact cardiology and if you know he is on A. Fib. Addendum: I spoke with the cardiologist covering for Dr. Reuel Boomaniel, given A. fib, age, hypertension he qualifies for anticoagulation, thus recommend  Eliquis.  (will rx 2.5 mg twice daily d/t baseline creat > 1.5  and age).  I spoke with the patient, explained the risk of atrial fibrillation and strokes, explained the risks of taking anticoagulants and sx of bleeding.  For now, will stay on low-dose aspirin given CAD.   Needs prompt follow-up with cardiology, he is to contact the office if he is not call soon. RTC 2 months

## 2017-10-28 NOTE — Progress Notes (Signed)
Pre visit review using our clinic review tool, if applicable. No additional management support is needed unless otherwise documented below in the visit note. 

## 2017-10-29 ENCOUNTER — Telehealth: Payer: Self-pay | Admitting: Internal Medicine

## 2017-10-29 NOTE — Telephone Encounter (Signed)
Okay to see cardiology in September, proceed with Eliquis as prescribed, if he has problems needs to call this office or call cardiology.

## 2017-10-29 NOTE — Telephone Encounter (Signed)
Copied from CRM [id number]. Topic: Quick Communication - Rx Refill/Question >> Oct 29, 2017 10:02 AM Crist InfanteHarrald, Kathy J wrote: Medication: apixaban (ELIQUIS) 2.5 MG TABS tablet  pt saw Dr Drue NovelPaz 8/5 and was prescribed this med.  Pt advised to follow up with his cardiologist asap.  First available he can see the cardiologist, is 9/23. Pt wants to know if he should take this med until then? What should he do until then?

## 2017-10-29 NOTE — Telephone Encounter (Signed)
Spoke w/ Pt- informed him of recommendations. Pt verbalized understanding.  

## 2017-10-29 NOTE — Telephone Encounter (Signed)
Please advise 

## 2017-11-03 ENCOUNTER — Other Ambulatory Visit: Payer: Self-pay | Admitting: Urology

## 2017-11-06 ENCOUNTER — Ambulatory Visit (HOSPITAL_COMMUNITY): Payer: Medicare Other

## 2017-11-06 ENCOUNTER — Encounter (HOSPITAL_COMMUNITY)
Admission: RE | Disposition: A | Discharge status: Home / Self Care | Payer: Self-pay | Source: Ambulatory Visit | Attending: Urology

## 2017-11-06 ENCOUNTER — Other Ambulatory Visit: Payer: Self-pay

## 2017-11-06 ENCOUNTER — Encounter (HOSPITAL_COMMUNITY): Payer: Self-pay | Admitting: *Deleted

## 2017-11-06 ENCOUNTER — Ambulatory Visit (HOSPITAL_COMMUNITY)
Admission: RE | Admit: 2017-11-06 | Discharge: 2017-11-06 | Disposition: A | Discharge status: Home / Self Care | Payer: Medicare Other | Source: Ambulatory Visit | Attending: Urology | Admitting: Urology

## 2017-11-06 DIAGNOSIS — N200 Calculus of kidney: Secondary | ICD-10-CM | POA: Insufficient documentation

## 2017-11-06 DIAGNOSIS — I340 Nonrheumatic mitral (valve) insufficiency: Secondary | ICD-10-CM | POA: Diagnosis not present

## 2017-11-06 DIAGNOSIS — N400 Enlarged prostate without lower urinary tract symptoms: Secondary | ICD-10-CM | POA: Insufficient documentation

## 2017-11-06 DIAGNOSIS — Z7901 Long term (current) use of anticoagulants: Secondary | ICD-10-CM | POA: Insufficient documentation

## 2017-11-06 DIAGNOSIS — I2510 Atherosclerotic heart disease of native coronary artery without angina pectoris: Secondary | ICD-10-CM | POA: Diagnosis not present

## 2017-11-06 DIAGNOSIS — Z87891 Personal history of nicotine dependence: Secondary | ICD-10-CM | POA: Diagnosis not present

## 2017-11-06 DIAGNOSIS — Z951 Presence of aortocoronary bypass graft: Secondary | ICD-10-CM | POA: Insufficient documentation

## 2017-11-06 DIAGNOSIS — R030 Elevated blood-pressure reading, without diagnosis of hypertension: Secondary | ICD-10-CM | POA: Diagnosis not present

## 2017-11-06 DIAGNOSIS — Z7982 Long term (current) use of aspirin: Secondary | ICD-10-CM | POA: Insufficient documentation

## 2017-11-06 DIAGNOSIS — Z79899 Other long term (current) drug therapy: Secondary | ICD-10-CM | POA: Insufficient documentation

## 2017-11-06 DIAGNOSIS — Z8249 Family history of ischemic heart disease and other diseases of the circulatory system: Secondary | ICD-10-CM | POA: Diagnosis not present

## 2017-11-06 DIAGNOSIS — I4891 Unspecified atrial fibrillation: Secondary | ICD-10-CM | POA: Insufficient documentation

## 2017-11-06 HISTORY — PX: EXTRACORPOREAL SHOCK WAVE LITHOTRIPSY: SHX1557

## 2017-11-06 HISTORY — DX: Personal history of urinary calculi: Z87.442

## 2017-11-06 SURGERY — LITHOTRIPSY, ESWL
Anesthesia: LOCAL | Laterality: Left

## 2017-11-06 MED ORDER — DIAZEPAM 5 MG PO TABS
10.0000 mg | ORAL_TABLET | ORAL | Status: AC
  Administered 2017-11-06: 10 mg via ORAL
  Filled 2017-11-06: qty 2

## 2017-11-06 MED ORDER — CIPROFLOXACIN HCL 500 MG PO TABS
500.0000 mg | ORAL_TABLET | ORAL | Status: AC
  Administered 2017-11-06: 500 mg via ORAL
  Filled 2017-11-06: qty 1

## 2017-11-06 MED ORDER — DIPHENHYDRAMINE HCL 25 MG PO CAPS
25.0000 mg | ORAL_CAPSULE | ORAL | Status: AC
  Administered 2017-11-06: 25 mg via ORAL
  Filled 2017-11-06: qty 1

## 2017-11-06 MED ORDER — SENNOSIDES-DOCUSATE SODIUM 8.6-50 MG PO TABS: 1.0000 | ORAL_TABLET | Freq: Two times a day (BID) | ORAL | 0 refills | Status: DC

## 2017-11-06 MED ORDER — TAMSULOSIN HCL 0.4 MG PO CAPS: 0.4000 mg | ORAL_CAPSULE | Freq: Every day | ORAL | 0 refills | Status: DC

## 2017-11-06 MED ORDER — TRAMADOL HCL 50 MG PO TABS: 50.0000 mg | ORAL_TABLET | Freq: Four times a day (QID) | ORAL | 0 refills | Status: DC | PRN

## 2017-11-06 MED ORDER — SODIUM CHLORIDE 0.9 % IV SOLN
INTRAVENOUS | Status: DC
  Administered 2017-11-06: 08:00:00 via INTRAVENOUS

## 2017-11-06 MED ORDER — ONDANSETRON HCL 4 MG PO TABS: 4.0000 mg | ORAL_TABLET | Freq: Every day | ORAL | 1 refills | Status: DC | PRN

## 2017-11-06 NOTE — H&P (Signed)
Denver FasterRobert J Boschert Jr. is an 83 y.o. male.    Chief Complaint: Pre-op LEFT Shockwave Lithotripsy  HPI:   1 - LEFT Renal Pelvis Stone - 13mm left renal pelvis stone with likely intermittent ball-valving obstruction. Stone is solitary, 13mm, 1130HU, SSD 13cm. Most recent UA without infectious parameters.  Today "Nadine CountsBob" is seen to proceed with LEFT shockwave lithotripsy for left renal pelvis stone with suspect periodic obstruction.  Past Medical History:  Diagnosis Date  . Atrial fibrillation (HCC) 02/2015  . BPH (benign prostatic hyperplasia)   . CAD (coronary artery disease) 08/09/2011   CAD, MI 07-2010, stents, then CABG 9-12  Cards -- f/u in [place]    . ELEVATED BLOOD PRESSURE WITHOUT DIAGNOSIS OF HYPERTENSION 05/19/2009   Qualifier: Diagnosis of  By: Drue NovelPaz MD, Jose E.   . Hyperlipidemia   . Mitral regurgitation 2012   seen on echo    Past Surgical History:  Procedure Laterality Date  . CARDIOVERSION  2012  . CORONARY ARTERY BYPASS GRAFT     (x5) 12-10-2010  . FACIAL FRACTURE SURGERY    . TONSILECTOMY, ADENOIDECTOMY, BILATERAL MYRINGOTOMY AND TUBES      Family History  Problem Relation Age of Onset  . Hypertension Father   . Hypertension Maternal Grandmother   . Colon cancer Neg Hx   . Prostate cancer Neg Hx    Social History:  reports that he has quit smoking. He has never used smokeless tobacco. He reports that he drinks alcohol. He reports that he does not use drugs.  Allergies: No Known Allergies  Medications Prior to Admission  Medication Sig Dispense Refill  . apixaban (ELIQUIS) 2.5 MG TABS tablet Take 1 tablet (2.5 mg total) by mouth 2 (two) times daily. 60 tablet 1  . aspirin EC 81 MG EC tablet Take 1 tablet (81 mg total) by mouth daily.    Marland Kitchen. atorvastatin (LIPITOR) 80 MG tablet Take 80 mg by mouth daily.    . finasteride (PROSCAR) 5 MG tablet Take 5 mg by mouth daily.    Marland Kitchen. lisinopril (PRINIVIL,ZESTRIL) 10 MG tablet Take 10 mg by mouth daily.    . metoprolol succinate  (TOPROL-XL) 25 MG 24 hr tablet Take 25 mg by mouth daily.    . Multiple Vitamin (MULTIVITAMIN) tablet Take 1 tablet by mouth daily.    . nitroGLYCERIN (NITROSTAT) 0.4 MG SL tablet Place 0.4 mg under the tongue every 5 (five) minutes as needed.    . phenylephrine (NEO-SYNEPHRINE) 0.5 % nasal solution Place 1 drop into both nostrils every 6 (six) hours as needed for congestion.    . tamsulosin (FLOMAX) 0.4 MG CAPS capsule Take 1 capsule (0.4 mg total) by mouth daily. (Patient not taking: Reported on 10/28/2017) 30 capsule 0    No results found for this or any previous visit (from the past 48 hour(s)). No results found.  Review of Systems  Constitutional: Negative.  Negative for chills and fever.  HENT: Negative.   Eyes: Negative.   Respiratory: Negative.   Cardiovascular: Negative.   Gastrointestinal: Negative.   Genitourinary: Positive for flank pain, frequency and urgency.  Skin: Negative.   Neurological: Negative.   Endo/Heme/Allergies: Negative.   Psychiatric/Behavioral: Negative.     Blood pressure (!) 151/86, pulse 72, temperature 98 F (36.7 C), temperature source Oral, resp. rate 16, height 6' (1.829 m), weight 78 kg, SpO2 100 %. Physical Exam  Constitutional: He appears well-developed.  HENT:  Head: Normocephalic.  Eyes: Pupils are equal, round, and reactive to light.  Neck: Normal range of motion.  Cardiovascular: Normal rate.  Respiratory: Effort normal.  GI: Soft.  Genitourinary:  Genitourinary Comments: Minimal left CVAT at present.   Neurological: He is alert.  Skin: Skin is warm.  Psychiatric: He has a normal mood and affect.     Assessment/Plan  1 - LEFT Renal Pelvis Stone -proceed as planned with LEFT SWL. Risks, benefits, alternatives, expected peri-op course discussed previously and reiterated today.   Sebastian AcheMANNY, Braelee Herrle, MD 11/06/2017, 7:08 AM

## 2017-11-06 NOTE — Discharge Instructions (Signed)
1 - You may have urinary urgency (bladder spasms), pass small stone fragments,  and bloody urine on / off for up to 2 weeks. This is normal.  2 - Call MD or go to ER for fever >102, severe pain / nausea / vomiting not relieved by medications, or acute change in medical status  3 - Resume Eliquus and Aspirin on Monday morning.

## 2017-11-06 NOTE — Brief Op Note (Signed)
11/06/2017  10:30 AM  PATIENT:  Adam Fasterobert J Premo Jr.  83 y.o. male  PRE-OPERATIVE DIAGNOSIS:  LEFT RENAL STONE  POST-OPERATIVE DIAGNOSIS:  * No post-op diagnosis entered *  PROCEDURE:  Procedure(s): LEFT EXTRACORPOREAL SHOCK WAVE LITHOTRIPSY (ESWL) (Left)  SURGEON:  Surgeon(s) and Role:    Sebastian Ache* Carmelina Balducci, MD - Primary  PHYSICIAN ASSISTANT:   ASSISTANTS: none   ANESTHESIA:   IV sedation  EBL:  minimal   BLOOD ADMINISTERED:none  DRAINS: none   LOCAL MEDICATIONS USED:  NONE  SPECIMEN:  No Specimen  DISPOSITION OF SPECIMEN:  N/A  COUNTS:  YES  TOURNIQUET:  * No tourniquets in log *  DICTATION: .Note written in paper chart  AS per [facility] note, this is likely a STAGED procedure based on size and complexity of stone.   PLAN OF CARE: Discharge to home after PACU  PATIENT DISPOSITION:  PACU - hemodynamically stable.   Delay start of Pharmacological VTE agent (>24hrs) due to surgical blood loss or risk of bleeding: yes

## 2017-11-10 ENCOUNTER — Encounter: Payer: Self-pay | Admitting: Internal Medicine

## 2017-11-10 DIAGNOSIS — I25118 Atherosclerotic heart disease of native coronary artery with other forms of angina pectoris: Secondary | ICD-10-CM | POA: Diagnosis not present

## 2017-11-10 DIAGNOSIS — Z87891 Personal history of nicotine dependence: Secondary | ICD-10-CM | POA: Diagnosis not present

## 2017-11-10 DIAGNOSIS — I340 Nonrheumatic mitral (valve) insufficiency: Secondary | ICD-10-CM | POA: Diagnosis not present

## 2017-11-10 DIAGNOSIS — Z951 Presence of aortocoronary bypass graft: Secondary | ICD-10-CM | POA: Diagnosis not present

## 2017-11-10 DIAGNOSIS — I252 Old myocardial infarction: Secondary | ICD-10-CM | POA: Diagnosis not present

## 2017-11-10 DIAGNOSIS — Z7901 Long term (current) use of anticoagulants: Secondary | ICD-10-CM | POA: Diagnosis not present

## 2017-11-10 DIAGNOSIS — I10 Essential (primary) hypertension: Secondary | ICD-10-CM | POA: Diagnosis not present

## 2017-11-10 DIAGNOSIS — E7800 Pure hypercholesterolemia, unspecified: Secondary | ICD-10-CM | POA: Diagnosis not present

## 2017-11-10 DIAGNOSIS — I480 Paroxysmal atrial fibrillation: Secondary | ICD-10-CM | POA: Diagnosis not present

## 2017-11-11 DIAGNOSIS — I4510 Unspecified right bundle-branch block: Secondary | ICD-10-CM | POA: Diagnosis not present

## 2017-11-11 DIAGNOSIS — I4892 Unspecified atrial flutter: Secondary | ICD-10-CM | POA: Diagnosis not present

## 2017-11-18 ENCOUNTER — Encounter: Payer: Self-pay | Admitting: Neurology

## 2017-11-18 ENCOUNTER — Encounter: Payer: Self-pay | Admitting: Internal Medicine

## 2017-11-18 ENCOUNTER — Ambulatory Visit (INDEPENDENT_AMBULATORY_CARE_PROVIDER_SITE_OTHER): Payer: Medicare Other | Admitting: Internal Medicine

## 2017-11-18 VITALS — BP 128/80 | HR 48 | Temp 97.8°F | Resp 18 | Ht 72.0 in | Wt 164.8 lb

## 2017-11-18 DIAGNOSIS — R202 Paresthesia of skin: Secondary | ICD-10-CM | POA: Diagnosis not present

## 2017-11-18 DIAGNOSIS — N218 Other lower urinary tract calculus: Secondary | ICD-10-CM | POA: Diagnosis not present

## 2017-11-18 DIAGNOSIS — I480 Paroxysmal atrial fibrillation: Secondary | ICD-10-CM | POA: Diagnosis not present

## 2017-11-18 LAB — CBC
HCT: 41.1 % (ref 39.0–52.0)
Hemoglobin: 14.1 g/dL (ref 13.0–17.0)
MCHC: 34.4 g/dL (ref 30.0–36.0)
MCV: 88.8 fl (ref 78.0–100.0)
Platelets: 134 10*3/uL — ABNORMAL LOW (ref 150.0–400.0)
RBC: 4.63 Mil/uL (ref 4.22–5.81)
RDW: 14.2 % (ref 11.5–15.5)
WBC: 4.5 10*3/uL (ref 4.0–10.5)

## 2017-11-18 LAB — B12 AND FOLATE PANEL
FOLATE: 21.5 ng/mL (ref >5.9)
VITAMIN B 12: 330 pg/mL (ref 211–911)

## 2017-11-18 LAB — BASIC METABOLIC PANEL
BUN: 34 mg/dL — AB (ref 6–23)
CALCIUM: 10.3 mg/dL (ref 8.4–10.5)
CO2: 25 mEq/L (ref 19–32)
CREATININE: 1.54 mg/dL — AB (ref 0.40–1.50)
Chloride: 106 mEq/L (ref 96–112)
GFR: 46.3 mL/min — AB (ref >60.00)
Glucose, Bld: 84 mg/dL (ref 70–99)
POTASSIUM: 4.1 meq/L (ref 3.5–5.1)
Sodium: 140 mEq/L (ref 135–145)

## 2017-11-18 NOTE — Progress Notes (Signed)
Subjective:    Patient ID: Adam Buchanan., male    DOB: 1935-03-06, 83 y.o.   MRN: [id number]  DOS:  11/18/2017 Type of visit - description : Follow-up Interval history: The last office visit, he was treated for urolithiasis on 11/06/2017. He also saw cardiology with regards to atrial fibrillation.  Note reviewed. At the end of the visit he also mentioned numbness at the lower extremities distally, this is going on for a while, feeling is day and night, not described as burning.  Just numbness.  Review of Systems Denies fever chills No gross hematuria or flank pain No back pain per se No claudication   Past Medical History:  Diagnosis Date  . Atrial fibrillation (HCC) 02/2015  . BPH (benign prostatic hyperplasia)   . CAD (coronary artery disease) 08/09/2011   CAD, MI 07-2010, stents, then CABG 9-12  Cards -- f/u in [place]    . ELEVATED BLOOD PRESSURE WITHOUT DIAGNOSIS OF HYPERTENSION 05/19/2009   Qualifier: Diagnosis of  By: Drue Novel MD, Nolon Rod.   . History of kidney stones   . Hyperlipidemia   . Mitral regurgitation 2012   seen on echo    Past Surgical History:  Procedure Laterality Date  . CARDIOVERSION  2012  . CORONARY ARTERY BYPASS GRAFT     (x5) 12-10-2010  . FACIAL FRACTURE SURGERY    . TONSILECTOMY, ADENOIDECTOMY, BILATERAL MYRINGOTOMY AND TUBES      Social History   Socioeconomic History  . Marital status: Married    Spouse name: Not on file  . Number of children: 2  . Years of education: Not on file  . Highest education level: Not on file  Occupational History  . Occupation: retired  Engineer, production  . Financial resource strain: Not on file  . Food insecurity:    Worry: Not on file    Inability: Not on file  . Transportation needs:    Medical: Not on file    Non-medical: Not on file  Tobacco Use  . Smoking status: Former Games developer  . Smokeless tobacco: Never Used  . Tobacco comment: quit 1990s  Substance and Sexual Activity  . Alcohol use: Yes    Comment:  socially  . Drug use: No  . Sexual activity: Not on file  Lifestyle  . Physical activity:    Days per week: Not on file    Minutes per session: Not on file  . Stress: Not on file  Relationships  . Social connections:    Talks on phone: Not on file    Gets together: Not on file    Attends religious service: Not on file    Active member of club or organization: Not on file    Attends meetings of clubs or organizations: Not on file    Relationship status: Not on file  . Intimate partner violence:    Fear of current or ex partner: Not on file    Emotionally abused: Not on file    Physically abused: Not on file    Forced sexual activity: Not on file  Other Topics Concern  . Not on file  Social History Narrative   Married , household-- pt, wife, disabled son (DM-amputee)       Allergies as of 11/18/2017   No Known Allergies     Medication List        Accurate as of 11/18/17 11:59 PM. Always use your most recent med list.  atorvastatin 80 MG tablet Commonly known as:  LIPITOR Take 80 mg by mouth daily.   ELIQUIS 2.5 MG Tabs tablet Generic drug:  apixaban Take 1 tablet by mouth 2 (two) times daily.   finasteride 5 MG tablet Commonly known as:  PROSCAR Take 5 mg by mouth daily.   lisinopril 10 MG tablet Commonly known as:  PRINIVIL,ZESTRIL Take 10 mg by mouth daily.   metoprolol succinate 25 MG 24 hr tablet Commonly known as:  TOPROL-XL Take 25 mg by mouth daily.   multivitamin tablet Take 1 tablet by mouth daily.   nitroGLYCERIN 0.4 MG SL tablet Commonly known as:  NITROSTAT Place 0.4 mg under the tongue every 5 (five) minutes as needed.   phenylephrine 0.5 % nasal solution Commonly known as:  NEO-SYNEPHRINE Place 1 drop into both nostrils every 6 (six) hours as needed for congestion.   traMADol 50 MG tablet Commonly known as:  ULTRAM Take 1-2 tablets (50-100 mg total) by mouth every 6 (six) hours as needed for severe pain. After lithotripsy           Objective:   Physical Exam BP 128/80 (BP Location: Left Arm, Cuff Size: Normal)   Pulse (!) 48   Temp 97.8 F (36.6 C) (Oral)   Resp 18   Ht 6' (1.829 m)   Wt 164 lb 12.8 oz (74.8 kg)   SpO2 99%   BMI 22.35 kg/m   General:   Well developed, NAD, see BMI.  HEENT:  Normocephalic . Face symmetric, atraumatic Lungs:  CTA B Normal respiratory effort, no intercostal retractions, no accessory muscle use. Heart: RRR,  no murmur.  No pretibial edema bilaterally  Skin: Not pale. Not jaundice Abdomen: No TTP, soft  MSK: Not TTP at the lower back Neurologic:  alert & oriented X3.  Speech normal, gait appropriate for age and unassisted. DTRs: Symmetric, ankle jerks decreased.  Pinprick examination of the distal lower extremities decreased. Psych--  Cognition and judgment appear intact.  Cooperative with normal attention span and concentration.  Behavior appropriate. No anxious or depressed appearing.      Assessment & Plan:   Assessment HTN Hyperlipidemia CRI  creat ~ 1.6,  us renal 2015 no obstruction  CV:  Cardiology @ [place] CAD: MI, stents 07-2010. CABG 11-2010. Atrial fib -- cardioversion 2012, on amiodarone  MR per echo  11-2010, no murmur, no need to repeat echo per cardiology note GU: --BPH, Elevated PSA reportedly had a bx Dr Vonita MossPeterson 2012 , 05-2013 PSA 12.11 -Hematuria, CT and cystoscopy late  2018, both ok, +urolithiasis( Dr. Mena GoesEskridge).  Rx finasteride Dilated abd aorta per us  05-2013, repeat ultrasound 5 years Osteopenia: T score -1.1 (01-2016).  Plan: Urolithiasis: Since the last visit, had lithotripsy 11/06/2017, he is now asymptomatic.  We will check a BMP-CBC Paroxysmal A. fib: He seems to be regular today on clinical grounds.  He saw cardiology 11/10/2017, they discussed different strategies and eventually agreed on rate control and anticoagulation.  His heart rate during that visit was 48, it is 48 today and he feels well.  No change Paresthesias:  Chronic per pt, new to me;  etiology unclear, denies persistent back pain.  Will check a B12, folic acid and refer to neurology. RTC 3 months

## 2017-11-18 NOTE — Patient Instructions (Signed)
GO TO THE LAB : Get the blood work     GO TO THE FRONT DESK Schedule your next appointment for a  checkup in 3 months  

## 2017-11-19 NOTE — Assessment & Plan Note (Signed)
Urolithiasis: Since the last visit, had lithotripsy 11/06/2017, he is now asymptomatic.  We will check a BMP-CBC Paroxysmal A. fib: He seems to be regular today on clinical grounds.  He saw cardiology 11/10/2017, they discussed different strategies and eventually agreed on rate control and anticoagulation.  His heart rate during that visit was 48, it is 48 today and he feels well.  No change Paresthesias: Chronic per pt, new to me;  etiology unclear, denies persistent back pain.  Will check a B12, folic acid and refer to neurology. RTC 3 months

## 2017-11-20 DIAGNOSIS — N200 Calculus of kidney: Secondary | ICD-10-CM | POA: Diagnosis not present

## 2017-12-04 ENCOUNTER — Other Ambulatory Visit: Payer: Self-pay

## 2017-12-04 NOTE — Telephone Encounter (Signed)
Ok send a RX  90 days and 1 RF

## 2017-12-04 NOTE — Telephone Encounter (Signed)
Fax request for Eliquis received from [facility].

## 2017-12-17 MED ORDER — APIXABAN 2.5 MG PO TABS: 2.5000 mg | ORAL_TABLET | Freq: Two times a day (BID) | ORAL | 1 refills | Status: DC

## 2017-12-30 ENCOUNTER — Ambulatory Visit: Payer: Medicare Other | Admitting: Internal Medicine

## 2018-01-06 DIAGNOSIS — Z23 Encounter for immunization: Secondary | ICD-10-CM | POA: Diagnosis not present

## 2018-01-12 ENCOUNTER — Encounter (HOSPITAL_COMMUNITY): Payer: Self-pay | Admitting: Urology

## 2018-02-09 ENCOUNTER — Other Ambulatory Visit: Payer: Self-pay

## 2018-02-18 ENCOUNTER — Encounter: Payer: Self-pay | Admitting: Neurology

## 2018-02-18 ENCOUNTER — Encounter

## 2018-02-18 ENCOUNTER — Telehealth: Payer: Self-pay

## 2018-02-18 ENCOUNTER — Ambulatory Visit (INDEPENDENT_AMBULATORY_CARE_PROVIDER_SITE_OTHER): Payer: Medicare Other | Admitting: Neurology

## 2018-02-18 VITALS — BP 120/80 | HR 72 | Ht 72.0 in | Wt 160.0 lb

## 2018-02-18 DIAGNOSIS — G609 Hereditary and idiopathic neuropathy, unspecified: Secondary | ICD-10-CM

## 2018-02-18 NOTE — Patient Instructions (Signed)
Please inspect your feet daily  Be careful on any uneven ground, such as grass, gravel or sand.  If your numbness gets worse, please call my office for a return visit

## 2018-02-18 NOTE — Progress Notes (Signed)
[facility] Clinic Note - Initial Visit   Date: 02/18/18  Denver Faster. MRN: [id number] DOB: 01/29/1935   Dear Dr. Drue Novel:  Thank you for your kind referral of Adam Buchanan. for consultation of bilateral feet numbness. Although his history is well known to you, please allow [place] to reiterate it for the purpose of our medical record. The patient was accompanied to the clinic by self.     History of Present Illness: Adam Buchanan. is a 83 y.o. right-handed Caucasian male with atrial fibrillation, hyperlipidemia, hypertension, CAD s/p PCI with stents and CABG, BPH, stage I CKD, and kidney stones presenting for evaluation of bilateral feet numbness.  Starting around 2013, he began having numbness of over the soles of the feet.  Symptoms are constant worse with walking.  He does not have leg pain, burning/tingling of the feet.  He has tried a cream, which did not help. He occasionally has leg cramps which is exacerbated by sleeping on his side and alleviated by sleeping supine.  No low back pain.  He has some imbalance.  He walks unassisted and has not suffered any falls.   He also has venostasis changes of the legs which is improved with elevation and wearing compression stockings.   No personal history of diabetes, alcohol use, or family history of neuropathy.   Out-side paper records, electronic medical record, and images have been reviewed where available and summarized as:  Lab Results  Component Value Date   TSH 1.66 11/14/2016   Lab Results  Component Value Date   VITAMINB12 330 11/18/2017    Past Medical History:  Diagnosis Date  . Atrial fibrillation (HCC) 02/2015  . BPH (benign prostatic hyperplasia)   . CAD (coronary artery disease) 08/09/2011   CAD, MI 07-2010, stents, then CABG 9-12  Cards -- f/u in [place]    . ELEVATED BLOOD PRESSURE WITHOUT DIAGNOSIS OF HYPERTENSION 05/19/2009   Qualifier: Diagnosis of  By: Drue Novel MD, Nolon Rod.   . History  of kidney stones   . Hyperlipidemia   . Mitral regurgitation 2012   seen on echo    Past Surgical History:  Procedure Laterality Date  . CARDIOVERSION  2012  . CORONARY ARTERY BYPASS GRAFT     (x5) 12-10-2010  . EXTRACORPOREAL SHOCK WAVE LITHOTRIPSY Left 11/06/2017   Procedure: LEFT EXTRACORPOREAL SHOCK WAVE LITHOTRIPSY (ESWL);  Surgeon: Sebastian Ache, MD;  Location: [facility];  Service: Urology;  Laterality: Left;  . FACIAL FRACTURE SURGERY    . TONSILECTOMY, ADENOIDECTOMY, BILATERAL MYRINGOTOMY AND TUBES       Medications:  Outpatient Encounter Medications as of 02/18/2018  Medication Sig Note  . apixaban (ELIQUIS) 2.5 MG TABS tablet Take 1 tablet (2.5 mg total) by mouth 2 (two) times daily.   Marland Kitchen atorvastatin (LIPITOR) 80 MG tablet Take 80 mg by mouth daily.   . finasteride (PROSCAR) 5 MG tablet Take 5 mg by mouth daily.   Marland Kitchen lisinopril (PRINIVIL,ZESTRIL) 10 MG tablet Take 10 mg by mouth daily.   . metoprolol succinate (TOPROL-XL) 25 MG 24 hr tablet Take 25 mg by mouth daily.   . Multiple Vitamin (MULTIVITAMIN) tablet Take 1 tablet by mouth daily.   . nitroGLYCERIN (NITROSTAT) 0.4 MG SL tablet Place 0.4 mg under the tongue every 5 (five) minutes as needed. 07/10/2016: PRN  . phenylephrine (NEO-SYNEPHRINE) 0.5 % nasal solution Place 1 drop into both nostrils every 6 (six) hours as needed for congestion.   . [DISCONTINUED]  traMADol (ULTRAM) 50 MG tablet Take 1-2 tablets (50-100 mg total) by mouth every 6 (six) hours as needed for severe pain. After lithotripsy    No facility-administered encounter medications on file as of 02/18/2018.      Allergies: No Known Allergies  Family History: Family History  Problem Relation Age of Onset  . Hypertension Father   . Hypertension Maternal Grandmother   . Colon cancer Neg Hx   . Prostate cancer Neg Hx     Social History: Social History   Tobacco Use  . Smoking status: Former Games developer  . Smokeless tobacco: Never Used  . Tobacco  comment: quit 1990s  Substance Use Topics  . Alcohol use: Yes    Comment: socially  . Drug use: No   Social History   Social History Narrative   Married , household-- pt, wife, disabled son (DM-amputee)    Lives in a one story home with his wife and son.  Has 2 children.  Retired.  Education: some college.     Review of Systems:  CONSTITUTIONAL: No fevers, chills, night sweats, or weight loss.   EYES: No visual changes or eye pain ENT: No hearing changes.  No history of nose bleeds.   RESPIRATORY: No cough, wheezing and shortness of breath.   CARDIOVASCULAR: Negative for chest pain, and palpitations.   GI: Negative for abdominal discomfort, blood in stools or black stools.  No recent change in bowel habits.   GU:  No history of incontinence.   MUSCLOSKELETAL: No history of joint pain +swelling.  No myalgias.   SKIN: Negative for lesions, rash, and itching.   HEMATOLOGY/ONCOLOGY: Negative for prolonged bleeding, bruising easily, and swollen nodes.  No history of cancer.   ENDOCRINE: Negative for cold or heat intolerance, polydipsia or goiter.   PSYCH:  No depression or anxiety symptoms.   NEURO: As Above.   Vital Signs:  BP 120/80   Pulse 72   Ht 6' (1.829 m)   Wt 160 lb (72.6 kg)   SpO2 98%   BMI 21.70 kg/m   General Medical Exam:   General:  Well appearing, comfortable.   Eyes/ENT: see cranial nerve examination.   Neck: No masses appreciated.  Full range of motion without tenderness.  No carotid bruits. Respiratory:  Clear to auscultation, good air entry bilaterally.   Cardiac:  Regular rate and rhythm, no murmur.   Extremities:  No deformities, edema.  Venostasis skin changes of the feet and ankles.    Skin:  No rashes or lesions.  Neurological Exam: MENTAL STATUS including orientation to time, place, person, recent and remote memory, attention span and concentration, language, and fund of knowledge is normal.  Speech is not dysarthric.  CRANIAL NERVES: II:  No  visual field defects.  Unremarkable fundi.   III-IV-VI: Pupils equal round and reactive to light.  Normal conjugate, extra-ocular eye movements in all directions of gaze.  No nystagmus.  No ptosis.   V:  Normal facial sensation.   VII:  Normal facial symmetry and movements.  No pathologic facial reflexes.  VIII:  Normal hearing and vestibular function.   IX-X:  Normal palatal movement.   XI:  Normal shoulder shrug and head rotation.   XII:  Normal tongue strength and range of motion, no deviation or fasciculation.  MOTOR:  No atrophy, fasciculations or abnormal movements.  No pronator drift.  Tone is normal.    Right Upper Extremity:    Left Upper Extremity:    Deltoid  5/5  Deltoid  5/5   Biceps  5/5   Biceps  5/5   Triceps  5/5   Triceps  5/5   Wrist extensors  5/5   Wrist extensors  5/5   Wrist flexors  5/5   Wrist flexors  5/5   Finger extensors  5/5   Finger extensors  5/5   Finger flexors  5/5   Finger flexors  5/5   Dorsal interossei  5/5   Dorsal interossei  5/5   Abductor pollicis  5/5   Abductor pollicis  5/5   Tone (Ashworth scale)  0  Tone (Ashworth scale)  0   Right Lower Extremity:    Left Lower Extremity:    Hip flexors  5/5   Hip flexors  5/5   Hip extensors  5/5   Hip extensors  5/5   Knee flexors  5/5   Knee flexors  5/5   Knee extensors  5/5   Knee extensors  5/5   Dorsiflexors  5/5   Dorsiflexors  5/5   Plantarflexors  5/5   Plantarflexors  5/5   Toe extensors  5-/5   Toe extensors  5-/5   Toe flexors  5-/5   Toe flexors  5-/5   Tone (Ashworth scale)  0  Tone (Ashworth scale)  0   MSRs:  Right                                                                 Left brachioradialis 2+  brachioradialis 2+  biceps 2+  biceps 2+  triceps 2+  triceps 2+  patellar 2+  patellar 2+  ankle jerk 0  ankle jerk 0  Hoffman no  Hoffman no  plantar response down  plantar response down   SENSORY:  Absent vibration, temperature, and pin prick distal to ankles bilaterally.   Rhomberg testing is positive.  COORDINATION/GAIT: Normal finger-to- nose-finger.  Intact rapid alternating movements bilaterally.  Able to rise from a chair without using arms.  Gait narrow based and stable. Tandem and stressed gait intact.    IMPRESSION: [name] is a 83 year-old man referred for evaluation of bilateral feet numbness.  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 I had extensive discussion with the patient regarding the pathogenesis, etiology, management, and natural course of neuropathy. Neuropathy tends to be slowly progressive, especially if a treatable etiology is not identified.  He has no risk factors for neuropathy - no diabetes, thyroid disease, or alcohol use.  His vitamin B12 and folate is normal.  I offered to check a few additional labs, which he declined.  He was satifisfied knowing the diagnosis and what to expect with it.  I talked to him about potential issues with imbalance and falls, especially since he is on anticoagulation.  Overall, sensory ataxia is very mild and fall precautions were discussed in addition to daily foot examination. If symptoms get worse, he will call my office and we can proceed with labs and/or NCS/EMG.    Thank you for allowing me to participate in patient's care.  If I can answer any additional questions, I would be pleased to do so.    Sincerely,    Margarie Mcguirt K. Allena KatzPatel, DO

## 2018-02-18 NOTE — Telephone Encounter (Signed)
Immunization record updated.

## 2018-02-18 NOTE — Telephone Encounter (Signed)
Copied from CRM [phone]. Topic: General - Other >> Feb 18, 2018 10:47 AM Crist InfanteHarrald, Kathy J wrote: Reason for CRM:  pt states he got her flu shot 12/29/17 [facility].  Pt would like to quit getting the reminder calls.

## 2018-02-24 ENCOUNTER — Ambulatory Visit (INDEPENDENT_AMBULATORY_CARE_PROVIDER_SITE_OTHER): Payer: Medicare Other | Admitting: Internal Medicine

## 2018-02-24 ENCOUNTER — Encounter: Payer: Self-pay | Admitting: Internal Medicine

## 2018-02-24 VITALS — BP 124/68 | HR 43 | Temp 97.8°F | Resp 16 | Ht 72.0 in | Wt 169.0 lb

## 2018-02-24 DIAGNOSIS — G629 Polyneuropathy, unspecified: Secondary | ICD-10-CM | POA: Diagnosis not present

## 2018-02-24 DIAGNOSIS — I10 Essential (primary) hypertension: Secondary | ICD-10-CM

## 2018-02-24 DIAGNOSIS — N19 Unspecified kidney failure: Secondary | ICD-10-CM

## 2018-02-24 NOTE — Progress Notes (Signed)
Pre visit review using our clinic review tool, if applicable. No additional management support is needed unless otherwise documented below in the visit note. 

## 2018-02-24 NOTE — Patient Instructions (Addendum)
Please schedule Medicare Wellness with Lawanna KobusAngel.     GO TO THE FRONT DESK Schedule your next appointment for a  Check up in 6 months

## 2018-02-24 NOTE — Assessment & Plan Note (Signed)
--  Td 2018 ; PNM 23: 2011; Prevnar -2016 ; shingrex discussed declined ; s/p flu shot   -- GNF:[id number]:colonoscopy 05/10/2004, declines further screening ,  reasonable according to guidelines. --PSAs per urology --Palpable aorta: Aorta visualized 3-20 15 with a renal ultrasound, they recommended a follow-up 2020.

## 2018-02-24 NOTE — Progress Notes (Signed)
Subjective:    Patient ID: Adam Fasterobert J Jurczyk Jr., male    DOB: 04-01-1934, 83 y.o.   MRN: [id number]  DOS:  02/24/2018 Type of visit - description : rov In general feeling well has no major concerns. Taking his medications as recommended Note from neurology reviewed.    Review of Systems Denies chest pain or difficulty breathing Has lower extremity edema, controlled with compression stockings Denies any blood in the urine.  Occasionally has minor bleeding from hemorrhoids but nothing severe.  Past Medical History:  Diagnosis Date  . Atrial fibrillation (HCC) 02/2015  . BPH (benign prostatic hyperplasia)   . CAD (coronary artery disease) 08/09/2011   CAD, MI 07-2010, stents, then CABG 9-12  Cards -- f/u in [place]    . ELEVATED BLOOD PRESSURE WITHOUT DIAGNOSIS OF HYPERTENSION 05/19/2009   Qualifier: Diagnosis of  By: Drue NovelPaz MD, Nolon RodJose E.   . History of kidney stones   . Hyperlipidemia   . Mitral regurgitation 2012   seen on echo    Past Surgical History:  Procedure Laterality Date  . CARDIOVERSION  2012  . CORONARY ARTERY BYPASS GRAFT     (x5) 12-10-2010  . EXTRACORPOREAL SHOCK WAVE LITHOTRIPSY Left 11/06/2017   Procedure: LEFT EXTRACORPOREAL SHOCK WAVE LITHOTRIPSY (ESWL);  Surgeon: Sebastian AcheManny, Theodore, MD;  Location: [facility];  Service: Urology;  Laterality: Left;  . FACIAL FRACTURE SURGERY    . TONSILECTOMY, ADENOIDECTOMY, BILATERAL MYRINGOTOMY AND TUBES      Social History   Socioeconomic History  . Marital status: Married    Spouse name: Not on file  . Number of children: 2  . Years of education: 6514  . Highest education level: Some college, no degree  Occupational History  . Occupation: retired  Engineer, productionocial Needs  . Financial resource strain: Not on file  . Food insecurity:    Worry: Not on file    Inability: Not on file  . Transportation needs:    Medical: Not on file    Non-medical: Not on file  Tobacco Use  . Smoking status: Former Games developermoker  . Smokeless tobacco: Never Used  .  Tobacco comment: quit 1990s  Substance and Sexual Activity  . Alcohol use: Yes    Comment: socially  . Drug use: No  . Sexual activity: Not on file  Lifestyle  . Physical activity:    Days per week: Not on file    Minutes per session: Not on file  . Stress: Not on file  Relationships  . Social connections:    Talks on phone: Not on file    Gets together: Not on file    Attends religious service: Not on file    Active member of club or organization: Not on file    Attends meetings of clubs or organizations: Not on file    Relationship status: Not on file  . Intimate partner violence:    Fear of current or ex partner: Not on file    Emotionally abused: Not on file    Physically abused: Not on file    Forced sexual activity: Not on file  Other Topics Concern  . Not on file  Social History Narrative   Married , household-- pt, wife, disabled son (DM-amputee)    Lives in a one story home with his wife and son.  Has 2 children.  Retired.  Education: some college.       Allergies as of 02/24/2018   No Known Allergies     Medication List  Accurate as of 02/24/18 11:59 PM. Always use your most recent med list.          apixaban 2.5 MG Tabs tablet Commonly known as:  ELIQUIS Take 1 tablet (2.5 mg total) by mouth 2 (two) times daily.   atorvastatin 80 MG tablet Commonly known as:  LIPITOR Take 80 mg by mouth daily.   finasteride 5 MG tablet Commonly known as:  PROSCAR Take 5 mg by mouth daily.   lisinopril 10 MG tablet Commonly known as:  PRINIVIL,ZESTRIL Take 10 mg by mouth daily.   metoprolol succinate 25 MG 24 hr tablet Commonly known as:  TOPROL-XL Take 25 mg by mouth daily.   multivitamin tablet Take 1 tablet by mouth daily.   nitroGLYCERIN 0.4 MG SL tablet Commonly known as:  NITROSTAT Place 0.4 mg under the tongue every 5 (five) minutes as needed.   phenylephrine 0.5 % nasal solution Commonly known as:  NEO-SYNEPHRINE Place 1 drop into both  nostrils every 6 (six) hours as needed for congestion.           Objective:   Physical Exam BP 124/68 (BP Location: Left Arm, Patient Position: Sitting, Cuff Size: Small)   Pulse (!) 43   Temp 97.8 F (36.6 C) (Oral)   Resp 16   Ht 6' (1.829 m)   Wt 169 lb (76.7 kg)   SpO2 98%   BMI 22.92 kg/m  General:   Well developed, NAD, BMI noted.  HEENT:  Normocephalic . Face symmetric, atraumatic Lungs:  CTA B Normal respiratory effort, no intercostal retractions, no accessory muscle use. Heart: Bradycardiac + pretibial / periankle edema bilaterally  Abdomen:  Not distended, soft.  Palpable, nontender aorta.  No bruit Skin: Not pale. Not jaundice Neurologic:  alert & oriented X3.  Speech normal, gait appropriate for age and unassisted Psych--  Cognition and judgment appear intact.  Cooperative with normal attention span and concentration.  Behavior appropriate. No anxious or depressed appearing.     Assessment & Plan:    Assessment HTN Hyperlipidemia CRI  creat ~ 1.6,  US renal 2015 no obstruction  CV:  Cardiology @ [place] CAD: MI, stents 07-2010. CABG 11-2010. Atrial fib -- cardioversion 2012, on amiodarone  MR per echo  11-2010, no murmur, no need to repeat echo per cardiology note GU: --BPH, Elevated PSA reportedly had a bx Dr Vonita Moss 2012 , 05-2013 PSA 12.11 -Hematuria, CT and cystoscopy late  2018, both ok, +urolithiasis( Dr. Mena Goes).  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  Last BMP satisfactory CRI: Last BMP satisfactory Atrial fibrillation: Rate controlled, anticoagulated without apparent complications Neuropathy: See last visit, had paresthesias, neurology DX: peripheral neuropathy.   Patient aware that there is treatment available if symptoms are bothersome. Preventive care reviewed. Palpable aorta: Aorta visualized 3-20 15 with a renal ultrasound, they recommended a follow-up 2020.  Will consider that. RTC 6 months

## 2018-02-25 DIAGNOSIS — G629 Polyneuropathy, unspecified: Secondary | ICD-10-CM | POA: Insufficient documentation

## 2018-02-25 NOTE — Assessment & Plan Note (Signed)
  HTN: Seems controlled on lisinopril, metoprolol.  Last BMP satisfactory CRI: Last BMP satisfactory Atrial fibrillation: Rate controlled, anticoagulated without apparent complications Neuropathy: See last visit, had paresthesias, neurology DX: peripheral neuropathy.  Patient aware that there is treatment available if symptoms are bothersome. Preventive care reviewed. Palpable aorta: Aorta visualized 3-20 15 with a renal ultrasound, they recommended a follow-up 2020.  Will consider that. RTC 6 months

## 2018-03-09 ENCOUNTER — Ambulatory Visit: Payer: Medicare Other | Admitting: *Deleted

## 2018-05-12 ENCOUNTER — Ambulatory Visit: Payer: Medicare Other | Admitting: Internal Medicine

## 2018-05-13 DIAGNOSIS — I480 Paroxysmal atrial fibrillation: Secondary | ICD-10-CM | POA: Diagnosis not present

## 2018-05-13 DIAGNOSIS — Z7901 Long term (current) use of anticoagulants: Secondary | ICD-10-CM | POA: Diagnosis not present

## 2018-05-13 DIAGNOSIS — Z951 Presence of aortocoronary bypass graft: Secondary | ICD-10-CM | POA: Diagnosis not present

## 2018-05-13 DIAGNOSIS — Z720 Tobacco use: Secondary | ICD-10-CM | POA: Diagnosis not present

## 2018-05-13 DIAGNOSIS — I10 Essential (primary) hypertension: Secondary | ICD-10-CM | POA: Diagnosis not present

## 2018-05-13 DIAGNOSIS — I25118 Atherosclerotic heart disease of native coronary artery with other forms of angina pectoris: Secondary | ICD-10-CM | POA: Diagnosis not present

## 2018-05-13 DIAGNOSIS — E785 Hyperlipidemia, unspecified: Secondary | ICD-10-CM | POA: Diagnosis not present

## 2018-05-13 DIAGNOSIS — I4892 Unspecified atrial flutter: Secondary | ICD-10-CM | POA: Diagnosis not present

## 2018-05-13 DIAGNOSIS — I340 Nonrheumatic mitral (valve) insufficiency: Secondary | ICD-10-CM | POA: Diagnosis not present

## 2018-05-19 DIAGNOSIS — R972 Elevated prostate specific antigen [PSA]: Secondary | ICD-10-CM | POA: Diagnosis not present

## 2018-05-19 DIAGNOSIS — N200 Calculus of kidney: Secondary | ICD-10-CM | POA: Diagnosis not present

## 2018-06-24 ENCOUNTER — Other Ambulatory Visit: Payer: Self-pay | Admitting: Internal Medicine

## 2018-09-02 NOTE — Progress Notes (Addendum)
Virtual Visit via Video Note  I connected with patient on 09/03/18 at 10:00 AM EDT by audio enabled telemedicine application and verified that I am speaking with the correct person using two identifiers.   THIS ENCOUNTER IS A VIRTUAL VISIT DUE TO COVID-19 - PATIENT WAS NOT SEEN IN THE OFFICE. PATIENT HAS CONSENTED TO VIRTUAL VISIT / TELEMEDICINE VISIT   Location of patient: home  Location of provider: office  I discussed the limitations of evaluation and management by telemedicine and the availability of in person appointments. The patient expressed understanding and agreed to proceed.   Subjective:   Adam Buchanan. is a 84 y.o. male who presents for Medicare Annual/Subsequent preventive examination.  Enjoys reading newspaper and [employer].  Review of Systems: No ROS.     See social history for additional risk factors.  Cardiac Risk Factors include: advanced age (>65men, >60 women);dyslipidemia;hypertension;male gender Sleep patterns: no issues Home Safety/Smoke Alarms: Feels safe in home. Smoke alarms in place.  Lives with wife and disabled son. Is caregiver for both.  Male:       PSA-  Lab Results  Component Value Date   PSA 9.52 08/21/2017   PSA 12.30 04/01/2016   PSA 12.13 (H) 01/17/2015       Objective:     Advanced Directives 09/03/2018 11/06/2017 10/16/2017 03/07/2017  Does Patient Have a Medical Advance Directive? No Yes No Yes  Type of Advance Directive - [place];Living will - Living will;Healthcare Power of Attorney  Does patient want to make changes to medical advance directive? - No - Patient declined - -  Copy of [place] in Chart? - No - copy requested - No - copy requested  Would patient like information on creating a medical advance directive? No - Patient declined No - Patient declined No - Patient declined -    Tobacco Social History   Tobacco Use  Smoking Status Former Smoker  Smokeless  Tobacco Never Used  Tobacco Comment   quit 1990s     Counseling given: Not Answered Comment: quit 1990s   Clinical Intake: Pain : No/denies pain    Past Medical History:  Diagnosis Date  . Atrial fibrillation ([place]) 02/2015  . BPH (benign prostatic hyperplasia)   . CAD (coronary artery disease) 08/09/2011   CAD, MI 07-2010, stents, then CABG 9-12  Cards -- f/u in [place]    . ELEVATED BLOOD PRESSURE WITHOUT DIAGNOSIS OF HYPERTENSION 05/19/2009   Qualifier: Diagnosis of  By: Larose Kells MD, [place] History of kidney stones   . Hyperlipidemia   . Mitral regurgitation 2012   seen on echo   Past Surgical History:  Procedure Laterality Date  . CARDIOVERSION  2012  . CORONARY ARTERY BYPASS GRAFT     (x5) 12-10-2010  . EXTRACORPOREAL SHOCK WAVE LITHOTRIPSY Left 11/06/2017   Procedure: LEFT EXTRACORPOREAL SHOCK WAVE LITHOTRIPSY (ESWL);  Surgeon: Alexis Frock, MD;  Location: [facility];  Service: Urology;  Laterality: Left;  . FACIAL FRACTURE SURGERY    . TONSILECTOMY, ADENOIDECTOMY, BILATERAL MYRINGOTOMY AND TUBES     Family History  Problem Relation Age of Onset  . Hypertension Father   . Hypertension Maternal Grandmother   . Colon cancer Neg Hx   . Prostate cancer Neg Hx    Social History   Socioeconomic History  . Marital status: Married    Spouse name: Not on file  . Number of children: 2  . Years of education: 28  .  Highest education level: Some college, no degree  Occupational History  . Occupation: retired  Engineer, productionocial Needs  . Financial resource strain: Not on file  . Food insecurity    Worry: Not on file    Inability: Not on file  . Transportation needs    Medical: Not on file    Non-medical: Not on file  Tobacco Use  . Smoking status: Former Games developermoker  . Smokeless tobacco: Never Used  . Tobacco comment: quit 1990s  Substance and Sexual Activity  . Alcohol use: Yes    Comment: socially  . Drug use: No  . Sexual activity: Not on file  Lifestyle  . Physical activity     Days per week: Not on file    Minutes per session: Not on file  . Stress: Not on file  Relationships  . Social Musicianconnections    Talks on phone: Not on file    Gets together: Not on file    Attends religious service: Not on file    Active member of club or organization: Not on file    Attends meetings of clubs or organizations: Not on file    Relationship status: Not on file  Other Topics Concern  . Not on file  Social History Narrative   Married , household-- pt, wife, disabled son (DM-amputee)    Lives in a one story home with his wife and son.  Has 2 children.  Retired.  Education: some college.     Outpatient Encounter Medications as of 09/03/2018  Medication Sig  . apixaban (ELIQUIS) 2.5 MG TABS tablet Take 1 tablet (2.5 mg total) by mouth 2 (two) times daily.  Marland Kitchen. atorvastatin (LIPITOR) 80 MG tablet Take 80 mg by mouth daily.  . finasteride (PROSCAR) 5 MG tablet Take 5 mg by mouth daily.  Marland Kitchen. lisinopril (PRINIVIL,ZESTRIL) 10 MG tablet Take 10 mg by mouth daily.  . metoprolol succinate (TOPROL-XL) 25 MG 24 hr tablet Take 25 mg by mouth daily.  . Multiple Vitamin (MULTIVITAMIN) tablet Take 1 tablet by mouth daily.  . phenylephrine (NEO-SYNEPHRINE) 0.5 % nasal solution Place 1 drop into both nostrils every 6 (six) hours as needed for congestion.  . nitroGLYCERIN (NITROSTAT) 0.4 MG SL tablet Place 0.4 mg under the tongue every 5 (five) minutes as needed.   No facility-administered encounter medications on file as of 09/03/2018.     Activities of Daily Living In your present state of health, do you have any difficulty performing the following activities: 09/03/2018 11/06/2017  Hearing? Malvin JohnsY Y  Comment wears bilateral hearing aids. hearing aides bilat currently not wearing   Vision? N N  Comment wears readers -  Difficulty concentrating or making decisions? N N  Walking or climbing stairs? N N  Dressing or bathing? N N  Doing errands, shopping? N -  Preparing Food and eating ? N -  Using  the Toilet? N -  In the past six months, have you accidently leaked urine? N -  Do you have problems with loss of bowel control? N -  Managing your Medications? N -  Managing your Finances? N -  Housekeeping or managing your Housekeeping? N -  Some recent data might be hidden    Patient Care Team: Wanda PlumpPaz, Jose E, MD as PCP - General Rohrbeck, Salvatore DecentSteven C., MD (Cardiology) Jerilee FieldEskridge, Matthew, MD as Consulting Physician (Urology) Lanell Mataraniel, Kurt R., DO as Consulting Physician (Cardiology)   Assessment:   This is a routine wellness examination for Adam MaduroRobert. Physical assessment deferred to  PCP.  Exercise Activities and Dietary recommendations Current Exercise Habits: Home exercise routine, Type of exercise: strength training/weights, Time (Minutes): 60, Frequency (Times/Week): 3, Weekly Exercise (Minutes/Week): 180, Exercise limited by: None identified Diet (meal preparation, eat out, water intake, caffeinated beverages, dairy products, fruits and vegetables): in general, a "healthy" diet  , well balanced, on average, 3 meals per day  Goals    . Maintain current health and independence       Fall Risk Fall Risk  09/03/2018 02/18/2018 02/09/2018 03/07/2017 11/21/2015  Falls in the past year? 0 0 0 No No  Comment - - Emmi Telephone Survey: data to providers prior to load - -  Number falls in past yr: - 0 - - -  Injury with Fall? - 0 - - -  Follow up - Falls evaluation completed - - -    Depression Screen PHQ 2/9 Scores 09/03/2018 02/24/2018 03/07/2017 11/21/2015  PHQ - 2 Score 0 0 0 0  PHQ- 9 Score - 0 - -    Cognitive Function Ad8 score reviewed for issues:  Issues making decisions:no  Less interest in hobbies / activities:no  Repeats questions, stories (family complaining):no  Trouble using ordinary gadgets (microwave, computer, phone):no  Forgets the month or year: no  Mismanaging finances: no  Remembering appts:no  Daily problems with thinking and/or memory:no Ad8 score is=0   MMSE - Mini Mental State Exam 03/07/2017  Orientation to time 5  Orientation to Place 5  Registration 3  Attention/ Calculation 5  Recall 0  Language- name 2 objects 2  Language- repeat 1  Language- follow 3 step command 3  Language- read & follow direction 1  Write a sentence 1  Copy design 1  Total score 27        Immunization History  Administered Date(s) Administered  . Influenza Split 02/21/2011, 01/29/2012  . Influenza Whole 12/16/2007, 01/05/2009, 01/08/2010  . Influenza, High Dose Seasonal PF 01/17/2015, 11/21/2015, 12/16/2016  . Influenza,inj,Quad PF,6+ Mos 12/22/2012, 12/09/2013  . Influenza-Unspecified 12/29/2017  . Pneumococcal Conjugate-13 01/17/2015  . Pneumococcal Polysaccharide-23 05/19/2009  . Td 03/07/2017    Screening Tests Health Maintenance  Topic Date Due  . INFLUENZA VACCINE  10/24/2018  . TETANUS/TDAP  03/08/2027  . PNA vac Low Risk Adult  Completed       Plan:   See you next year!  Continue to eat heart healthy diet (full of fruits, vegetables, whole grains, lean protein, water--limit salt, fat, and sugar intake) and increase physical activity as tolerated.  Continue doing brain stimulating activities (puzzles, reading, adult coloring books, staying active) to keep memory sharp.    I have personally reviewed and noted the following in the patient's chart:   . Medical and social history . Use of alcohol, tobacco or illicit drugs  . Current medications and supplements . Functional ability and status . Nutritional status . Physical activity . Advanced directives . List of other physicians . Hospitalizations, surgeries, and ER visits in previous 12 months . Vitals . Screenings to include cognitive, depression, and falls . Referrals and appointments  In addition, I have reviewed and discussed with patient certain preventive protocols, quality metrics, and best practice recommendations. A written personalized care plan for preventive  services as well as general preventive health recommendations were provided to patient.     Mady HaagensenBritt, Eular Panek GeorgetownAngel, [place]RN  09/03/2018  Willow OraJose Paz, MD

## 2018-09-03 ENCOUNTER — Other Ambulatory Visit: Payer: Self-pay

## 2018-09-03 ENCOUNTER — Encounter: Payer: Self-pay | Admitting: *Deleted

## 2018-09-03 ENCOUNTER — Ambulatory Visit (INDEPENDENT_AMBULATORY_CARE_PROVIDER_SITE_OTHER): Payer: Medicare Other | Admitting: Internal Medicine

## 2018-09-03 ENCOUNTER — Ambulatory Visit (INDEPENDENT_AMBULATORY_CARE_PROVIDER_SITE_OTHER): Payer: Medicare Other | Admitting: *Deleted

## 2018-09-03 DIAGNOSIS — I10 Essential (primary) hypertension: Secondary | ICD-10-CM

## 2018-09-03 DIAGNOSIS — E785 Hyperlipidemia, unspecified: Secondary | ICD-10-CM

## 2018-09-03 DIAGNOSIS — I480 Paroxysmal atrial fibrillation: Secondary | ICD-10-CM | POA: Diagnosis not present

## 2018-09-03 DIAGNOSIS — Z0000 Encounter for general adult medical examination without abnormal findings: Secondary | ICD-10-CM | POA: Diagnosis not present

## 2018-09-03 DIAGNOSIS — I25810 Atherosclerosis of coronary artery bypass graft(s) without angina pectoris: Secondary | ICD-10-CM

## 2018-09-03 NOTE — Progress Notes (Signed)
Subjective:    Patient ID: Adam Fasterobert J Morelos Jr., male    DOB: 02-26-1935, 84 y.o.   MRN: [id number]  DOS:  09/03/2018 Type of visit - description:  Attempted  to make this a video visit, due to technical difficulties from the patient side it was not possible  thus we proceeded with a Virtual Visit via Telephone    I connected with@ on 09/03/18 at  9:40 AM EDT by telephone and verified that I am speaking with the correct person using two identifiers.  THIS ENCOUNTER IS A VIRTUAL VISIT DUE TO COVID-19 - PATIENT WAS NOT SEEN IN THE OFFICE. PATIENT HAS CONSENTED TO VIRTUAL VISIT / TELEMEDICINE VISIT   Location of patient: home  Location of provider: office  I discussed the limitations, risks, security and privacy concerns of performing an evaluation and management service by telephone and the availability of in person appointments. I also discussed with the patient that there may be a patient responsible charge related to this service. The patient expressed understanding and agreed to proceed.   History of Present Illness: Routine office visit HTN: Good med compliance, ambulatory BPs when checked normal CAD: Saw cardiology, note reviewed, was felt to be doing well.   High cholesterol: Due for FLP COVID-19: Following good precautions.  Has not gotten sick. A. Fibrillation: Anticoagulated without apparent problems.   Review of Systems Denies recent fever chills No chest pain or difficulty breathing No palpitations No abdominal pain no blood in the stools No cough No dysuria or gross hematuria Occasionally is forgetful but denies any major things such as getting lost or unable to pay his bills.  Still independent.  Past Medical History:  Diagnosis Date  . Atrial fibrillation (HCC) 02/2015  . BPH (benign prostatic hyperplasia)   . CAD (coronary artery disease) 08/09/2011   CAD, MI 07-2010, stents, then CABG 9-12  Cards -- f/u in [place]    . ELEVATED BLOOD PRESSURE WITHOUT DIAGNOSIS OF  HYPERTENSION 05/19/2009   Qualifier: Diagnosis of  By: Drue NovelPaz MD, Nolon RodJose E.   . History of kidney stones   . Hyperlipidemia   . Mitral regurgitation 2012   seen on echo    Past Surgical History:  Procedure Laterality Date  . CARDIOVERSION  2012  . CORONARY ARTERY BYPASS GRAFT     (x5) 12-10-2010  . EXTRACORPOREAL SHOCK WAVE LITHOTRIPSY Left 11/06/2017   Procedure: LEFT EXTRACORPOREAL SHOCK WAVE LITHOTRIPSY (ESWL);  Surgeon: Sebastian AcheManny, Theodore, MD;  Location: [facility];  Service: Urology;  Laterality: Left;  . FACIAL FRACTURE SURGERY    . TONSILECTOMY, ADENOIDECTOMY, BILATERAL MYRINGOTOMY AND TUBES      Social History   Socioeconomic History  . Marital status: Married    Spouse name: Not on file  . Number of children: 2  . Years of education: 5314  . Highest education level: Some college, no degree  Occupational History  . Occupation: retired  Engineer, productionocial Needs  . Financial resource strain: Not on file  . Food insecurity    Worry: Not on file    Inability: Not on file  . Transportation needs    Medical: Not on file    Non-medical: Not on file  Tobacco Use  . Smoking status: Former Games developermoker  . Smokeless tobacco: Never Used  . Tobacco comment: quit 1990s  Substance and Sexual Activity  . Alcohol use: Yes    Comment: socially  . Drug use: No  . Sexual activity: Not on file  Lifestyle  . Physical activity  Days per week: Not on file    Minutes per session: Not on file  . Stress: Not on file  Relationships  . Social Musicianconnections    Talks on phone: Not on file    Gets together: Not on file    Attends religious service: Not on file    Active member of club or organization: Not on file    Attends meetings of clubs or organizations: Not on file    Relationship status: Not on file  . Intimate partner violence    Fear of current or ex partner: Not on file    Emotionally abused: Not on file    Physically abused: Not on file    Forced sexual activity: Not on file  Other Topics Concern   . Not on file  Social History Narrative   Married , household-- pt, wife, disabled son (DM-amputee)    Lives in a one story home with his wife and son.  Has 2 children.  Retired.  Education: some college.       Allergies as of 09/03/2018   No Known Allergies     Medication List       Accurate as of September 03, 2018  4:45 PM. If you have any questions, ask your nurse or doctor.        apixaban 2.5 MG Tabs tablet Commonly known as: Eliquis Take 1 tablet (2.5 mg total) by mouth 2 (two) times daily.   atorvastatin 80 MG tablet Commonly known as: LIPITOR Take 80 mg by mouth daily.   finasteride 5 MG tablet Commonly known as: PROSCAR Take 5 mg by mouth daily.   lisinopril 10 MG tablet Commonly known as: ZESTRIL Take 10 mg by mouth daily.   metoprolol succinate 25 MG 24 hr tablet Commonly known as: TOPROL-XL Take 25 mg by mouth daily.   multivitamin tablet Take 1 tablet by mouth daily.   nitroGLYCERIN 0.4 MG SL tablet Commonly known as: NITROSTAT Place 0.4 mg under the tongue every 5 (five) minutes as needed.   phenylephrine 0.5 % nasal solution Commonly known as: NEO-SYNEPHRINE Place 1 drop into both nostrils every 6 (six) hours as needed for congestion.           Objective:   Physical Exam There were no vitals taken for this visit. The patient is alert oriented x3, speech is fluent, no distress.  He does not seem to be forgetful  (remember details of his last appointment with cardiology and his future appointment with urology)    Assessment    Assessment HTN Hyperlipidemia CRI  creat ~ 1.6,  us renal 2015 no obstruction  CV:  Cardiology @ [place] CAD: MI, stents 07-2010. CABG 11-2010. Atrial fib -- cardioversion 2012, on amiodarone  MR per echo  11-2010, no murmur, no need to repeat echo per cardiology note GU: --BPH, Elevated PSA reportedly had a bx Dr Vonita MossPeterson 2012 , 05-2013 PSA 12.11 -Hematuria, CT and cystoscopy late  2018, both ok, +urolithiasis( Dr.  Mena GoesEskridge).  Rx finasteride Dilated abd aorta per [place]  05-2013, repeat ultrasound 5 years Osteopenia: T score -1.1 (01-2016). Neuropathy : saw neuro 01/2018 (" examination shows a distal predominant large fiber peripheral neuropathy" pt declined further labs/tests)  PLAN:  HTN: Currently on lisinopril, metoprolol.  Reports that ambulatory BPs are normal.  Check CMP Hyperlipidemia: On Lipitor, check FLP. Atrial fibrillation, anticoagulated without apparent problems.  Check a CBC CAD: Controlling CVRFs, last visit with cardiology 04-2017, note reviewed, doing okay, no  changes COVID-19: Following all the necessary precautions Recommend early flu shot this season Plan: Labs in few days, RTC CPX December 2020, will set up.      I discussed the assessment and treatment plan with the patient. The patient was provided an opportunity to ask questions and all were answered. The patient agreed with the plan and demonstrated an understanding of the instructions.   The patient was advised to call back or seek an in-person evaluation if the symptoms worsen or if the condition fails to improve as anticipated.  I provided 25 minutes of non-face-to-face time during this encounter.  Kathlene November, MD

## 2018-09-03 NOTE — Patient Instructions (Signed)
See you next year!  Continue to eat heart healthy diet (full of fruits, vegetables, whole grains, lean protein, water--limit salt, fat, and sugar intake) and increase physical activity as tolerated.  Continue doing brain stimulating activities (puzzles, reading, adult coloring books, staying active) to keep memory sharp.    Adam Buchanan , Thank you for taking time to come for your Medicare Wellness Visit. I appreciate your ongoing commitment to your health goals. Please review the following plan we discussed and let me know if I can assist you in the future.   These are the goals we discussed: Goals    . Maintain current health and independence       This is a list of the screening recommended for you and due dates:  Health Maintenance  Topic Date Due  . Flu Shot  10/24/2018  . Tetanus Vaccine  03/08/2027  . Pneumonia vaccines  Completed    Health Maintenance After Age 82 After age 82, you are at a higher risk for certain long-term diseases and infections as well as injuries from falls. Falls are a major cause of broken bones and head injuries in people who are older than age 82. Getting regular preventive care can help to keep you healthy and well. Preventive care includes getting regular testing and making lifestyle changes as recommended by your health care provider. Talk with your health care provider about:  Which screenings and tests you should have. A screening is a test that checks for a disease when you have no symptoms.  A diet and exercise plan that is right for you. What should I know about screenings and tests to prevent falls? Screening and testing are the best ways to find a health problem early. Early diagnosis and treatment give you the best chance of managing medical conditions that are common after age 82. Certain conditions and lifestyle choices may make you more likely to have a fall. Your health care provider may recommend:  Regular vision checks. Poor vision and  conditions such as cataracts can make you more likely to have a fall. If you wear glasses, make sure to get your prescription updated if your vision changes.  Medicine review. Work with your health care provider to regularly review all of the medicines you are taking, including over-the-counter medicines. Ask your health care provider about any side effects that may make you more likely to have a fall. Tell your health care provider if any medicines that you take make you feel dizzy or sleepy.  Osteoporosis screening. Osteoporosis is a condition that causes the bones to get weaker. This can make the bones weak and cause them to break more easily.  Blood pressure screening. Blood pressure changes and medicines to control blood pressure can make you feel dizzy.  Strength and balance checks. Your health care provider may recommend certain tests to check your strength and balance while standing, walking, or changing positions.  Foot health exam. Foot pain and numbness, as well as not wearing proper footwear, can make you more likely to have a fall.  Depression screening. You may be more likely to have a fall if you have a fear of falling, feel emotionally low, or feel unable to do activities that you used to do.  Alcohol use screening. Using too much alcohol can affect your balance and may make you more likely to have a fall. What actions can I take to lower my risk of falls? General instructions  Talk with your health care provider  about your risks for falling. Tell your health care provider if: ? You fall. Be sure to tell your health care provider about all falls, even ones that seem minor. ? You feel dizzy, sleepy, or off-balance.  Take over-the-counter and prescription medicines only as told by your health care provider. These include any supplements.  Eat a healthy diet and maintain a healthy weight. A healthy diet includes low-fat dairy products, low-fat (lean) meats, and fiber from whole  grains, beans, and lots of fruits and vegetables. Home safety  Remove any tripping hazards, such as rugs, cords, and clutter.  Install safety equipment such as grab bars in bathrooms and safety rails on stairs.  Keep rooms and walkways well-lit. Activity   Follow a regular exercise program to stay fit. This will help you maintain your balance. Ask your health care provider what types of exercise are appropriate for you.  If you need a cane or walker, use it as recommended by your health care provider.  Wear supportive shoes that have nonskid soles. Lifestyle  Do not drink alcohol if your health care provider tells you not to drink.  If you drink alcohol, limit how much you have: ? 0-1 drink a day for women. ? 0-2 drinks a day for men.  Be aware of how much alcohol is in your drink. In the [place] one drink equals one typical bottle of beer (12 oz), one-half glass of wine (5 oz), or one shot of hard liquor (1 oz).  Do not use any products that contain nicotine or tobacco, such as cigarettes and e-cigarettes. If you need help quitting, ask your health care provider. Summary  Having a healthy lifestyle and getting preventive care can help to protect your health and wellness after age 87.  Screening and testing are the best way to find a health problem early and help you avoid having a fall. Early diagnosis and treatment give you the best chance for managing medical conditions that are more common for people who are older than age 53.  Falls are a major cause of broken bones and head injuries in people who are older than age 83. Take precautions to prevent a fall at home.  Work with your health care provider to learn what changes you can make to improve your health and wellness and to prevent falls. This information is not intended to replace advice given to you by your health care provider. Make sure you discuss any questions you have with your health care provider. Document  Released: 01/22/2017 Document Revised: 01/22/2017 Document Reviewed: 01/22/2017 Elsevier Interactive Patient Education  2019 [employer].

## 2018-09-03 NOTE — Assessment & Plan Note (Signed)
HTN: Currently on lisinopril, metoprolol.  Reports that ambulatory BPs are normal.  Check CMP Hyperlipidemia: On Lipitor, check FLP. Atrial fibrillation, anticoagulated without apparent problems.  Check a CBC CAD: Controlling CVRFs, last visit with cardiology 04-2017, note reviewed, doing okay, no changes COVID-19: Following all the necessary precautions Recommend early flu shot this season Plan: Labs in few days, RTC CPX December 2020, will set up.

## 2018-09-10 DIAGNOSIS — R972 Elevated prostate specific antigen [PSA]: Secondary | ICD-10-CM | POA: Diagnosis not present

## 2018-09-10 LAB — PSA: PSA: 4.51

## 2018-09-17 DIAGNOSIS — N400 Enlarged prostate without lower urinary tract symptoms: Secondary | ICD-10-CM | POA: Diagnosis not present

## 2018-09-30 ENCOUNTER — Encounter: Payer: Self-pay | Admitting: Internal Medicine

## 2018-10-27 ENCOUNTER — Telehealth: Payer: Self-pay | Admitting: Internal Medicine

## 2018-10-27 NOTE — Telephone Encounter (Signed)
Patient recently lost his wife, called today to provide condolences and support. He is doing "okay" for the circumstances. So far he does not feel he needs formal counseling or medication. He is due for a visit in December but is likely that he will be moving out of the state with his daughter. Knows to call anytime if he needs help.

## 2018-12-09 DIAGNOSIS — Z23 Encounter for immunization: Secondary | ICD-10-CM | POA: Diagnosis not present

## 2019-02-24 ENCOUNTER — Encounter: Payer: Self-pay | Admitting: Internal Medicine

## 2019-02-24 ENCOUNTER — Ambulatory Visit (INDEPENDENT_AMBULATORY_CARE_PROVIDER_SITE_OTHER): Payer: Medicare Other | Admitting: Internal Medicine

## 2019-02-24 ENCOUNTER — Other Ambulatory Visit: Payer: Self-pay

## 2019-02-24 VITALS — BP 170/85 | HR 82 | Temp 97.6°F | Resp 16 | Ht 72.0 in | Wt 169.4 lb

## 2019-02-24 DIAGNOSIS — I714 Abdominal aortic aneurysm, without rupture, unspecified: Secondary | ICD-10-CM

## 2019-02-24 DIAGNOSIS — E785 Hyperlipidemia, unspecified: Secondary | ICD-10-CM | POA: Diagnosis not present

## 2019-02-24 DIAGNOSIS — I10 Essential (primary) hypertension: Secondary | ICD-10-CM

## 2019-02-24 DIAGNOSIS — I25810 Atherosclerosis of coronary artery bypass graft(s) without angina pectoris: Secondary | ICD-10-CM

## 2019-02-24 DIAGNOSIS — R0989 Other specified symptoms and signs involving the circulatory and respiratory systems: Secondary | ICD-10-CM | POA: Diagnosis not present

## 2019-02-24 LAB — CBC WITH DIFFERENTIAL/PLATELET
Basophils Absolute: 0 10*3/uL (ref 0.0–0.1)
Basophils Relative: 0.6 % (ref 0.0–3.0)
Eosinophils Absolute: 0 10*3/uL (ref 0.0–0.7)
Eosinophils Relative: 1 % (ref 0.0–5.0)
HCT: 42.2 % (ref 39.0–52.0)
Hemoglobin: 14 g/dL (ref 13.0–17.0)
Lymphocytes Relative: 23.5 % (ref 12.0–46.0)
Lymphs Abs: 1.2 10*3/uL (ref 0.7–4.0)
MCHC: 33.1 g/dL (ref 30.0–36.0)
MCV: 91.4 fl (ref 78.0–100.0)
Monocytes Absolute: 0.5 10*3/uL (ref 0.1–1.0)
Monocytes Relative: 9.5 % (ref 3.0–12.0)
Neutro Abs: 3.2 10*3/uL (ref 1.4–7.7)
Neutrophils Relative %: 65.4 % (ref 43.0–77.0)
Platelets: 135 10*3/uL — ABNORMAL LOW (ref 150.0–400.0)
RBC: 4.62 Mil/uL (ref 4.22–5.81)
RDW: 15.1 % (ref 11.5–15.5)
WBC: 4.9 10*3/uL (ref 4.0–10.5)

## 2019-02-24 LAB — LIPID PANEL
Cholesterol: 109 mg/dL (ref 0–200)
HDL: 44 mg/dL (ref >39.00)
LDL Cholesterol: 57 mg/dL (ref 0–99)
NonHDL: 64.65
Total CHOL/HDL Ratio: 2
Triglycerides: 40 mg/dL (ref 0.0–149.0)
VLDL: 8 mg/dL (ref 0.0–40.0)

## 2019-02-24 LAB — COMPREHENSIVE METABOLIC PANEL
ALT: 29 U/L (ref 0–53)
AST: 24 U/L (ref 0–37)
Albumin: 4.2 g/dL (ref 3.5–5.2)
Alkaline Phosphatase: 73 U/L (ref 39–117)
BUN: 22 mg/dL (ref 6–23)
CO2: 24 mEq/L (ref 19–32)
Calcium: 9.8 mg/dL (ref 8.4–10.5)
Chloride: 110 mEq/L (ref 96–112)
Creatinine, Ser: 1.38 mg/dL (ref 0.40–1.50)
GFR: 49.29 mL/min — ABNORMAL LOW (ref >60.00)
Glucose, Bld: 88 mg/dL (ref 70–99)
Potassium: 4.3 mEq/L (ref 3.5–5.1)
Sodium: 141 mEq/L (ref 135–145)
Total Bilirubin: 1 mg/dL (ref 0.2–1.2)
Total Protein: 6.5 g/dL (ref 6.0–8.3)

## 2019-02-24 NOTE — Progress Notes (Signed)
Subjective:    Patient ID: Adam Buchanan., male    DOB: [birth year], 85 y.o.   MRN: [id number]  DOS:  02/24/2019 Type of visit - description: Routine office visit HTN: Ambulatory BP consistently 130/70 High cholesterol: Good med compliance, due for labs CAD: Has a pending appointment with cardiology, no symptoms Stress: Unfortunately lost his wife recently, takes care of his disabled child, very stressed.    Review of Systems Denies fever chills No chest pain no difficulty breathing Has dependent lower extremity edema well controlled with compression stockings No nausea, vomiting, diarrhea. From time to time sees fresh blood with BMs, from hemorrhoids. Otherwise the stools are normal.   Past Medical History:  Diagnosis Date  . Atrial fibrillation ([place]) 02/2015  . BPH (benign prostatic hyperplasia)   . CAD (coronary artery disease) 08/09/2011   CAD, MI 07-2010, stents, then CABG 9-12  Cards -- f/u in [place]    . ELEVATED BLOOD PRESSURE WITHOUT DIAGNOSIS OF HYPERTENSION 05/19/2009   Qualifier: Diagnosis of  By: Larose Kells MD, [place] History of kidney stones   . Hyperlipidemia   . Mitral regurgitation 2012   seen on echo    Past Surgical History:  Procedure Laterality Date  . CARDIOVERSION  2012  . CORONARY ARTERY BYPASS GRAFT     (x5) 12-10-2010  . EXTRACORPOREAL SHOCK WAVE LITHOTRIPSY Left 11/06/2017   Procedure: LEFT EXTRACORPOREAL SHOCK WAVE LITHOTRIPSY (ESWL);  Surgeon: Alexis Frock, MD;  Location: [facility];  Service: Urology;  Laterality: Left;  . FACIAL FRACTURE SURGERY    . TONSILECTOMY, ADENOIDECTOMY, BILATERAL MYRINGOTOMY AND TUBES      Social History   Socioeconomic History  . Marital status: Widowed    Spouse name: Not on file  . Number of children: 2  . Years of education: 72  . Highest education level: Some college, no degree  Occupational History  . Occupation: retired  Scientific laboratory technician  . Financial resource strain: Not on file  . Food insecurity   Worry: Not on file    Inability: Not on file  . Transportation needs    Medical: Not on file    Non-medical: Not on file  Tobacco Use  . Smoking status: Former Research scientist (life sciences)  . Smokeless tobacco: Never Used  . Tobacco comment: quit 1990s  Substance and Sexual Activity  . Alcohol use: Yes    Comment: socially  . Drug use: No  . Sexual activity: Not on file  Lifestyle  . Physical activity    Days per week: Not on file    Minutes per session: Not on file  . Stress: Not on file  Relationships  . Social Herbalist on phone: Not on file    Gets together: Not on file    Attends religious service: Not on file    Active member of club or organization: Not on file    Attends meetings of clubs or organizations: Not on file    Relationship status: Not on file  . Intimate partner violence    Fear of current or ex partner: Not on file    Emotionally abused: Not on file    Physically abused: Not on file    Forced sexual activity: Not on file  Other Topics Concern  . Not on file  Social History Narrative   Married , household-- pt,   disabled son (DM-amputee)    Lost wife ~ 11/2018   Lives in a one story home  Has 2 children.  Retired.  Education: some college.       Allergies as of 02/24/2019   No Known Allergies     Medication List       Accurate as of February 24, 2019 11:59 PM. If you have any questions, ask your nurse or doctor.        STOP taking these medications   phenylephrine 0.5 % nasal solution Commonly known as: NEO-SYNEPHRINE Stopped by: Willow Ora, MD     TAKE these medications   apixaban 2.5 MG Tabs tablet Commonly known as: Eliquis Take 1 tablet (2.5 mg total) by mouth 2 (two) times daily.   atorvastatin 80 MG tablet Commonly known as: LIPITOR Take 80 mg by mouth daily.   finasteride 5 MG tablet Commonly known as: PROSCAR Take 5 mg by mouth daily.   lisinopril 10 MG tablet Commonly known as: ZESTRIL Take 10 mg by mouth daily.   metoprolol  succinate 25 MG 24 hr tablet Commonly known as: TOPROL-XL Take 25 mg by mouth daily.   multivitamin tablet Take 1 tablet by mouth daily.   nitroGLYCERIN 0.4 MG SL tablet Commonly known as: NITROSTAT Place 0.4 mg under the tongue every 5 (five) minutes as needed.           Objective:   Physical Exam BP (!) 170/85 (BP Location: Left Arm)   Pulse 82   Temp 97.6 F (36.4 C) (Temporal)   Resp 16   Ht 6' (1.829 m)   Wt 169 lb 6 oz (76.8 kg)   SpO2 100%   BMI 22.97 kg/m  General: Well developed, NAD, BMI noted Neck: No  thyromegaly  HEENT:  Normocephalic . Face symmetric, atraumatic Lungs:  CTA B Normal respiratory effort, no intercostal retractions, no accessory muscle use. Heart: Bradycardic, seems irregular Trace peri-ankle > pretibial edema bilaterally  Abdomen:  Not distended, soft, non-tender. No rebound or rigidity.  Palpable aorta at the upper abdomen.  Not tender Skin: Exposed areas without rash. Not pale. Not jaundice Neurologic:  alert & oriented X3.  Speech normal, gait appropriate for age and unassisted Strength symmetric and appropriate for age.  Psych: Cognition and judgment appear intact.  Cooperative with normal attention span and concentration.  Behavior appropriate. No anxious or depressed appearing.     Assessment     Assessment HTN Hyperlipidemia CRI  creat ~ 1.6,  US renal 2015 no obstruction  CV:  Cardiology @ [facility] CAD: MI, stents 07-2010. CABG 11-2010. Atrial fib -- cardioversion 2012, on amiodarone  MR per echo  11-2010, no murmur, no need to repeat echo per cardiology note GU: --BPH, Elevated PSA reportedly had a bx Dr Vonita Moss 2012 , 05-2013 PSA 12.11 -Hematuria, CT and cystoscopy late  2018, both ok, +urolithiasis( Dr. Mena Goes).  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  Reports at home blood pressures are 130/70. Currently on lisinopril, metoprolol.    Check a CMP.  Nurse visit in 2 weeks for BP check, consider adjust medication.  Probably increase ACE inhibitor dose or change to ARB is with close creatinine monitor. Hyperlipidemia: On Lipitor, check a FLP CAD, atrial fibrillation: Check a CBC.  Good compliance with Eliquis. Palpable aorta, suspect (r/o)  aortic aneurysm, Ao previously  enlarged, check ultrasound Social: Lost his wife 11-2018, moving to [place] is from-  in the next few months. RTC nurse visit 2-3 weeks RTC here 6 months   This visit occurred during the SARS-CoV-2 public health emergency.  Safety protocols were in place, including screening questions prior to the visit, additional usage of staff PPE, and extensive cleaning of exam room while observing appropriate contact time as indicated for disinfecting solutions.

## 2019-02-24 NOTE — Progress Notes (Signed)
Pre visit review using our clinic review tool, if applicable. No additional management support is needed unless otherwise documented below in the visit note. 

## 2019-02-24 NOTE — Patient Instructions (Addendum)
GO TO THE LAB : Get the blood work     [place] Schedule a nurse visit to 3 weeks from today to recheck your blood pressure Schedule your next appointment  For a checkup in 6 months   Stop by the first floor and schedule the ultrasound of your aorta  Check the  blood pressure twice a week BP GOAL is between 110/65 and  135/85. If it is consistently higher or lower, let me know   Watch your salt intake

## 2019-02-25 NOTE — Assessment & Plan Note (Signed)
Preventive care reviewed HTN: BP elevated today even when recheck.  Reports at home blood pressures are 130/70. Currently on lisinopril, metoprolol.    Check a CMP.  Nurse visit in 2 weeks for BP check, consider adjust medication.  Probably increase ACE inhibitor dose or change to ARB is with close creatinine monitor. Hyperlipidemia: On Lipitor, check a FLP CAD, atrial fibrillation: Check a CBC.  Good compliance with Eliquis. Palpable aorta, suspect (r/o)  aortic aneurysm, Ao previously enlarged, check ultrasound Social: Lost his wife 11-2018, moving to [place] - where he is from-  in the next few months. RTC nurse visit 2-3 weeks RTC here 6 months

## 2019-02-25 NOTE — Assessment & Plan Note (Signed)
-  Td 2018 - PNM 23: 2011 -Prevnar-2016 -shingrex -- discussed benefits  -  Had a recent  flu shot per pt   -- CCS: Cscope 05/10/2004,declines further screening which is okay with guidelines --PSAs per urology

## 2019-03-02 ENCOUNTER — Ambulatory Visit (HOSPITAL_BASED_OUTPATIENT_CLINIC_OR_DEPARTMENT_OTHER)
Admission: RE | Admit: 2019-03-02 | Discharge: 2019-03-02 | Disposition: A | Discharge status: Home / Self Care | Payer: Medicare Other | Source: Ambulatory Visit | Attending: Internal Medicine | Admitting: Internal Medicine

## 2019-03-02 ENCOUNTER — Other Ambulatory Visit: Payer: Self-pay

## 2019-03-02 DIAGNOSIS — R0989 Other specified symptoms and signs involving the circulatory and respiratory systems: Secondary | ICD-10-CM | POA: Diagnosis not present

## 2019-03-02 DIAGNOSIS — I714 Abdominal aortic aneurysm, without rupture: Secondary | ICD-10-CM | POA: Insufficient documentation

## 2019-03-16 ENCOUNTER — Other Ambulatory Visit: Payer: Self-pay

## 2019-03-16 ENCOUNTER — Ambulatory Visit (INDEPENDENT_AMBULATORY_CARE_PROVIDER_SITE_OTHER): Payer: Medicare Other | Admitting: Internal Medicine

## 2019-03-16 DIAGNOSIS — I25810 Atherosclerosis of coronary artery bypass graft(s) without angina pectoris: Secondary | ICD-10-CM

## 2019-03-16 DIAGNOSIS — I10 Essential (primary) hypertension: Secondary | ICD-10-CM | POA: Diagnosis not present

## 2019-03-16 NOTE — Progress Notes (Addendum)
Pt here for Blood pressure check per Dr. Larose Kells  Pt currently takes:lisinopril 10 mg, metoprolol 25 mg   Pt reports compliance with medication.  BP today @ = 167 /87 HR =51  Please advise on what patient is to do as you were in a visit at the time of his nurse visit, I have placed readings on your desk for review from patients home machine. I will call patient with advice.    The patient also brought a number of ambulatory BPs for the last week: All of them are very good ranging from 128/71 and only 1 time was minimally elevated at 143/72. Please advise patient: BPs are very good at home, I do not recommend a change in medication at this point.  Continue monitoring, if BPs are consistently above 145/85 let me know other than that he needs to come back in June for a routine checkup. JP    This visit occurred during the SARS-CoV-2 public health emergency.  Safety protocols were in place, including screening questions prior to the visit, additional usage of staff PPE, and extensive cleaning of exam room while observing appropriate contact time as indicated for disinfecting solutions.

## 2019-05-19 DIAGNOSIS — I340 Nonrheumatic mitral (valve) insufficiency: Secondary | ICD-10-CM | POA: Diagnosis not present

## 2019-05-19 DIAGNOSIS — I480 Paroxysmal atrial fibrillation: Secondary | ICD-10-CM | POA: Diagnosis not present

## 2019-05-19 DIAGNOSIS — I10 Essential (primary) hypertension: Secondary | ICD-10-CM | POA: Diagnosis not present

## 2019-05-19 DIAGNOSIS — E7800 Pure hypercholesterolemia, unspecified: Secondary | ICD-10-CM | POA: Diagnosis not present

## 2019-05-19 DIAGNOSIS — I25118 Atherosclerotic heart disease of native coronary artery with other forms of angina pectoris: Secondary | ICD-10-CM | POA: Diagnosis not present

## 2019-05-19 DIAGNOSIS — Z7901 Long term (current) use of anticoagulants: Secondary | ICD-10-CM | POA: Diagnosis not present

## 2019-05-19 DIAGNOSIS — I2510 Atherosclerotic heart disease of native coronary artery without angina pectoris: Secondary | ICD-10-CM | POA: Diagnosis not present

## 2019-05-21 ENCOUNTER — Ambulatory Visit: Payer: Medicare Other | Attending: Internal Medicine

## 2019-05-21 DIAGNOSIS — Z23 Encounter for immunization: Secondary | ICD-10-CM | POA: Insufficient documentation

## 2019-05-21 NOTE — Progress Notes (Signed)
   [id number]  Name:  Johnathin Vanderschaaf.    MRN: [id number] DOB: [birth year]  05/21/2019  Mr. Giannini was observed post Covid-19 immunization for 15 minutes without incidence. He was provided with Vaccine Information Sheet and instruction to access the V-Safe system.   Mr. Delucia was instructed to call 911 with any severe reactions post vaccine: Marland Kitchen Difficulty breathing  . Swelling of your face and throat  . A fast heartbeat  . A bad rash all over your body  . Dizziness and weakness    Immunizations Administered    Name Date Dose VIS Date Route   Pfizer COVID-19 Vaccine 05/21/2019 10:34 AM 0.3 mL 03/05/2019 Intramuscular   Manufacturer: [employer], [employer]   Lot: [id number]   NDC: [address]-4

## 2019-06-07 ENCOUNTER — Other Ambulatory Visit: Payer: Self-pay | Admitting: Internal Medicine

## 2019-06-15 ENCOUNTER — Ambulatory Visit: Payer: Medicare Other | Attending: Internal Medicine

## 2019-06-15 DIAGNOSIS — Z23 Encounter for immunization: Secondary | ICD-10-CM

## 2019-06-15 NOTE — Progress Notes (Signed)
   [id number]  Name:  Adam Buchanan.    MRN: [id number] DOB: 04-Apr-1934  06/15/2019  [name] was observed post Covid-19 immunization for 15 minutes without incident. He was provided with Vaccine Information Sheet and instruction to access the V-Safe system.   [name] was instructed to call 911 with any severe reactions post vaccine: Marland Kitchen Difficulty breathing  . Swelling of face and throat  . A fast heartbeat  . A bad rash all over body  . Dizziness and weakness   Immunizations Administered    Name Date Dose VIS Date Route   Pfizer COVID-19 Vaccine 06/15/2019  1:45 PM 0.3 mL 03/05/2019 Intramuscular   Manufacturer: [employer], [employer]   Lot: [id number]   NDC: [address]-3

## 2019-07-13 ENCOUNTER — Other Ambulatory Visit: Payer: Self-pay

## 2019-07-14 ENCOUNTER — Other Ambulatory Visit: Payer: Self-pay

## 2019-07-14 ENCOUNTER — Ambulatory Visit (INDEPENDENT_AMBULATORY_CARE_PROVIDER_SITE_OTHER): Payer: Medicare Other | Admitting: Internal Medicine

## 2019-07-14 ENCOUNTER — Encounter: Payer: Self-pay | Admitting: Internal Medicine

## 2019-07-14 VITALS — BP 150/71 | HR 39 | Temp 97.4°F | Resp 18 | Ht 72.0 in | Wt 175.2 lb

## 2019-07-14 DIAGNOSIS — I10 Essential (primary) hypertension: Secondary | ICD-10-CM | POA: Diagnosis not present

## 2019-07-14 MED ORDER — HYDROCHLOROTHIAZIDE 12.5 MG PO TABS: 12.5000 mg | ORAL_TABLET | Freq: Every day | ORAL | 0 refills | Status: DC

## 2019-07-14 NOTE — Patient Instructions (Addendum)
Decrease metoprolol 25 mg, 1/2 tablet daily  Start hydrochlorothiazide 12.5 mg 1 tablet in the morning  Other medications the same  Check your blood pressures daily BP GOAL is between 110/65 and  135/85. If it is consistently higher or lower, let me know    GO TO THE FRONT DESK, PLEASE SCHEDULE YOUR APPOINTMENTS Come back for   blood work in 10 days to 2 weeks, no need to be fasting  Come back for for a checkup with me in 2 months

## 2019-07-14 NOTE — Progress Notes (Signed)
Pre visit review using our clinic review tool, if applicable. No additional management support is needed unless otherwise documented below in the visit note. 

## 2019-07-14 NOTE — Progress Notes (Signed)
Subjective:    Patient ID: Adam Buchanan., male    DOB: 12/01/1934, 85 y.o.   MRN: [id number]  DOS:  07/14/2019 Type of visit - description: Acute His main concern is BP management. BPs in the last 3 weeks have been elevated and he self increased beta-blockers.   Review of Systems Denies chest pain or difficulty breathing No nausea, vomiting, diarrhea No headache, dizziness or near syncope  Past Medical History:  Diagnosis Date  . Atrial fibrillation ([facility]) 02/2015  . BPH (benign prostatic hyperplasia)   . CAD (coronary artery disease) 08/09/2011   CAD, MI 07-2010, stents, then CABG 9-12  Cards -- f/u in [place]    . ELEVATED BLOOD PRESSURE WITHOUT DIAGNOSIS OF HYPERTENSION 05/19/2009   Qualifier: Diagnosis of  By: Drue Novel MD, [place] Rod.   . History of kidney stones   . Hyperlipidemia   . Mitral regurgitation 2012   seen on echo    Past Surgical History:  Procedure Laterality Date  . CARDIOVERSION  2012  . CORONARY ARTERY BYPASS GRAFT     (x5) 12-10-2010  . EXTRACORPOREAL SHOCK WAVE LITHOTRIPSY Left 11/06/2017   Procedure: LEFT EXTRACORPOREAL SHOCK WAVE LITHOTRIPSY (ESWL);  Surgeon: Sebastian Ache, MD;  Location: [facility];  Service: Urology;  Laterality: Left;  . FACIAL FRACTURE SURGERY    . TONSILECTOMY, ADENOIDECTOMY, BILATERAL MYRINGOTOMY AND TUBES      Allergies as of 07/14/2019   No Known Allergies     Medication List       Accurate as of July 14, 2019 11:59 PM. If you have any questions, ask your nurse or doctor.        apixaban 2.5 MG Tabs tablet Commonly known as: Eliquis Take 1 tablet (2.5 mg total) by mouth 2 (two) times daily.   atorvastatin 80 MG tablet Commonly known as: LIPITOR Take 80 mg by mouth daily.   finasteride 5 MG tablet Commonly known as: PROSCAR Take 5 mg by mouth daily.   hydrochlorothiazide 12.5 MG tablet Commonly known as: HYDRODIURIL Take 1 tablet (12.5 mg total) by mouth daily. Started by: Willow Ora, MD   lisinopril 10 MG  tablet Commonly known as: ZESTRIL Take 10 mg by mouth daily.   metoprolol succinate 25 MG 24 hr tablet Commonly known as: TOPROL-XL Take 0.5 tablets (12.5 mg total) by mouth daily. What changed: how much to take Changed by: Willow Ora, MD   multivitamin tablet Take 1 tablet by mouth daily.   nitroGLYCERIN 0.4 MG SL tablet Commonly known as: NITROSTAT Place 0.4 mg under the tongue every 5 (five) minutes as needed.          Objective:   Physical Exam BP (!) 150/71 (BP Location: Left Arm, Patient Position: Sitting, Cuff Size: Normal)   Pulse (!) 39   Temp (!) 97.4 F (36.3 C) (Temporal)   Resp 18   Ht 6' (1.829 m)   Wt 175 lb 4 oz (79.5 kg)   SpO2 99%   BMI 23.77 kg/m  General:   Well developed, NAD, BMI noted. HEENT:  Normocephalic . Face symmetric, atraumatic Lungs:  CTA B Normal respiratory effort, no intercostal retractions, no accessory muscle use. Heart: Bradycardic, heart rate 40 .  Lower extremities: + to ++/ +++  pretibial edema bilaterally  Skin: Not pale. Not jaundice Neurologic:  alert & oriented X3.  Speech normal, gait appropriate for age and unassisted Psych--  Cognition and judgment appear intact.  Cooperative with normal attention span and concentration.  Behavior  appropriate. No anxious or depressed appearing.      Assessment      Assessment HTN Hyperlipidemia CRI  creat ~ 1.6,  US renal 2015 no obstruction  CV:  Cardiology @ [facility] CAD: MI, stents 07-2010. CABG 11-2010. Atrial fib -- cardioversion 2012, on amiodarone  MR per echo  11-2010, no murmur, no need to repeat echo per cardiology note GU: --BPH, Elevated PSA reportedly had a bx Dr Terance Hart 2012 , 05-2013 PSA 12.11 -Hematuria, CT and cystoscopy late  2018, both ok, +urolithiasis( Dr. Junious Silk).  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 Neuropathy : saw neuro 01/2018 (" examination shows a distal predominant  large fiber peripheral neuropathy" pt declined further labs/tests)  PLAN:  HTN: Last visit with cardiology 05/19/2019: At the time heart rate was 48 and appeared regular.  Metoprolol decreased to 12.5 mg daily.  Other medications for blood pressure include lisinopril 10 mg. After that, BPs increased, at home as high as 180/102.  Patient went back to   metoprolol  25 mg qd and is still getting occasional elevated BP readings. Heart rate today is 40 with no symptoms. Add amlodipine?  He already has dependent edema. Plan: Decrease metoprolol again to 12.5 mg qd  as suggested by cardiology Add HCTZ 12.5 mg with close follow-up of kidney function Continue lisinopril BMP in 2 weeks, office visit with me in 2 months, monitor BPs.   This visit occurred during the SARS-CoV-2 public health emergency.  Safety protocols were in place, including screening questions prior to the visit, additional usage of staff PPE, and extensive cleaning of exam room while observing appropriate contact time as indicated for disinfecting solutions.

## 2019-07-15 NOTE — Assessment & Plan Note (Signed)
HTN: Last visit with cardiology 05/19/2019: At the time heart rate was 48 and appeared regular.  Metoprolol decreased to 12.5 mg daily.  Other medications for blood pressure include lisinopril 10 mg. After that, BPs increased, at home as high as 180/102.  Patient went back to   metoprolol  25 mg qd and is still getting occasional elevated BP readings. Heart rate today is 40 with no symptoms. Add amlodipine?  He already has dependent edema. Plan: Decrease metoprolol again to 12.5 mg qd  as suggested by cardiology Add HCTZ 12.5 mg with close follow-up of kidney function Continue lisinopril BMP in 2 weeks, office visit with me in 2 months, monitor BPs.

## 2019-07-28 ENCOUNTER — Ambulatory Visit: Payer: Medicare Other | Admitting: Internal Medicine

## 2019-07-28 ENCOUNTER — Other Ambulatory Visit: Payer: Medicare Other

## 2019-08-06 ENCOUNTER — Other Ambulatory Visit: Payer: Self-pay | Admitting: Internal Medicine

## 2019-08-06 DIAGNOSIS — I10 Essential (primary) hypertension: Secondary | ICD-10-CM

## 2019-08-09 ENCOUNTER — Other Ambulatory Visit: Payer: Self-pay

## 2019-08-09 MED ORDER — APIXABAN 2.5 MG PO TABS: 2.5000 mg | ORAL_TABLET | Freq: Two times a day (BID) | ORAL | 0 refills | Status: DC

## 2019-08-12 ENCOUNTER — Other Ambulatory Visit: Payer: Self-pay

## 2019-08-12 MED ORDER — ATORVASTATIN CALCIUM 80 MG PO TABS: 80.0000 mg | ORAL_TABLET | Freq: Every day | ORAL | 1 refills | Status: DC

## 2019-08-13 ENCOUNTER — Telehealth: Payer: Self-pay | Admitting: Internal Medicine

## 2019-08-13 NOTE — Telephone Encounter (Signed)
Sherri- can you try contacting Pt to schedule lab appt at his earliest convenience please? (Non-fasting okay).

## 2019-08-13 NOTE — Telephone Encounter (Signed)
See visit from 07/14/2019, he is overdue for a BMP, DX HTN.  Please arrange

## 2019-08-13 NOTE — Telephone Encounter (Signed)
done

## 2019-08-17 ENCOUNTER — Other Ambulatory Visit: Payer: Self-pay | Admitting: Internal Medicine

## 2019-08-30 ENCOUNTER — Other Ambulatory Visit: Payer: Self-pay

## 2019-08-30 ENCOUNTER — Other Ambulatory Visit (INDEPENDENT_AMBULATORY_CARE_PROVIDER_SITE_OTHER): Payer: Medicare Other

## 2019-08-30 DIAGNOSIS — I10 Essential (primary) hypertension: Secondary | ICD-10-CM

## 2019-08-30 LAB — BASIC METABOLIC PANEL
BUN: 23 mg/dL (ref 6–23)
CO2: 25 mEq/L (ref 19–32)
Calcium: 9.8 mg/dL (ref 8.4–10.5)
Chloride: 108 mEq/L (ref 96–112)
Creatinine, Ser: 1.33 mg/dL (ref 0.40–1.50)
GFR: 51.37 mL/min — ABNORMAL LOW (ref >60.00)
Glucose, Bld: 93 mg/dL (ref 70–99)
Potassium: 4 mEq/L (ref 3.5–5.1)
Sodium: 140 mEq/L (ref 135–145)

## 2019-09-02 ENCOUNTER — Other Ambulatory Visit: Payer: Self-pay | Admitting: Internal Medicine

## 2019-09-06 NOTE — Progress Notes (Signed)
I connected with Adam Buchanan today by telephone and verified that I am speaking with the correct person using two identifiers. Location patient: home Location provider: work Persons participating in the virtual visit: patient, Engineer, civil (consulting).    I discussed the limitations, risks, security and privacy concerns of performing an evaluation and management service by telephone and the availability of in person appointments. I also discussed with the patient that there may be a patient responsible charge related to this service. The patient expressed understanding and verbally consented to this telephonic visit.    Interactive audio and video telecommunications were attempted between this RN and patient, however failed, due to patient having technical difficulties OR patient did not have access to video capability.  We continued and completed visit with audio only.  Some vital signs may be absent or patient reported.    Subjective:   Adam Buchanan. is a 85 y.o. male who presents for Medicare Annual/Subsequent preventive examination.  Enjoys reading [facility], [place], and the news paper daily.  Pt states he and his son will be moving to [place] soon to live w/ his dtr.  Review of Systems:  Home Safety/Smoke Alarms: Feels safe in home. Smoke alarms in place.  Lives w/ disabled son. Pt is caregiver.  Male:   CCS-No longer doing routine screening due to age.     PSA-  Lab Results  Component Value Date   PSA 4.51 09/10/2018   PSA 9.52 08/21/2017   PSA 12.30 04/01/2016       Objective:    Vitals: Unable to assess. This visit is enabled though telemedicine due to Covid 19.   Advanced Directives 09/07/2019 09/03/2018 11/06/2017 10/16/2017 03/07/2017  Does Patient Have a Medical Advance Directive? Yes No Yes No Yes  Type of Estate agent of [place];Living will - Healthcare Power of [place];Living will - Living will;Healthcare Power of Attorney  Does patient want to make  changes to medical advance directive? No - Patient declined - No - Patient declined - -  Copy of Healthcare Power of Attorney in Chart? No - copy requested - No - copy requested - No - copy requested  Would patient like information on creating a medical advance directive? - No - Patient declined No - Patient declined No - Patient declined -    Tobacco Social History   Tobacco Use  Smoking Status Former Smoker  Smokeless Tobacco Never Used  Tobacco Comment   quit 1990s     Counseling given: Not Answered Comment: quit 1990s   Clinical Intake: Pain : No/denies pain     Past Medical History:  Diagnosis Date  . Atrial fibrillation (HCC) 02/2015  . BPH (benign prostatic hyperplasia)   . CAD (coronary artery disease) 08/09/2011   CAD, MI 07-2010, stents, then CABG 9-12  Cards -- f/u in [place]    . ELEVATED BLOOD PRESSURE WITHOUT DIAGNOSIS OF HYPERTENSION 05/19/2009   Qualifier: Diagnosis of  By: Drue Novel MD, Nolon Rod.   . History of kidney stones   . Hyperlipidemia   . Mitral regurgitation 2012   seen on echo   Past Surgical History:  Procedure Laterality Date  . CARDIOVERSION  2012  . CORONARY ARTERY BYPASS GRAFT     (x5) 12-10-2010  . EXTRACORPOREAL SHOCK WAVE LITHOTRIPSY Left 11/06/2017   Procedure: LEFT EXTRACORPOREAL SHOCK WAVE LITHOTRIPSY (ESWL);  Surgeon: Sebastian Ache, MD;  Location: [facility];  Service: Urology;  Laterality: Left;  . FACIAL FRACTURE SURGERY    . TONSILECTOMY, ADENOIDECTOMY,  BILATERAL MYRINGOTOMY AND TUBES     Family History  Problem Relation Age of Onset  . Hypertension Father   . Hypertension Maternal Grandmother   . Colon cancer Neg Hx   . Prostate cancer Neg Hx    Social History   Socioeconomic History  . Marital status: Widowed    Spouse name: Not on file  . Number of children: 2  . Years of education: 24  . Highest education level: Some college, no degree  Occupational History  . Occupation: retired  Tobacco Use  . Smoking status: Former Games developer    . Smokeless tobacco: Never Used  . Tobacco comment: quit 1990s  Vaping Use  . Vaping Use: Never used  Substance and Sexual Activity  . Alcohol use: Yes    Comment: socially  . Drug use: No  . Sexual activity: Not on file  Other Topics Concern  . Not on file  Social History Narrative   Married , household-- pt,   disabled son (DM-amputee)    Lost wife ~ 11/2018   Lives in a one story home   Has 2 children.  Retired.  Education: some college.    Social Determinants of Health   Financial Resource Strain:   . Difficulty of Paying Living Expenses:   Food Insecurity:   . Worried About Programme researcher, broadcasting/film/video in the Last Year:   . Barista in the Last Year:   Transportation Needs:   . Freight forwarder (Medical):   Marland Kitchen Lack of Transportation (Non-Medical):   Physical Activity:   . Days of Exercise per Week:   . Minutes of Exercise per Session:   Stress:   . Feeling of Stress :   Social Connections:   . Frequency of Communication with Friends and Family:   . Frequency of Social Gatherings with Friends and Family:   . Attends Religious Services:   . Active Member of Clubs or Organizations:   . Attends Banker Meetings:   Marland Kitchen Marital Status:     Outpatient Encounter Medications as of 09/07/2019  Medication Sig  . apixaban (ELIQUIS) 2.5 MG TABS tablet Take 1 tablet (2.5 mg total) by mouth 2 (two) times daily.  Marland Kitchen atorvastatin (LIPITOR) 80 MG tablet Take 1 tablet (80 mg total) by mouth daily.  . finasteride (PROSCAR) 5 MG tablet Take 5 mg by mouth daily.  . hydrochlorothiazide (HYDRODIURIL) 12.5 MG tablet Take 1 tablet (12.5 mg total) by mouth daily.  Marland Kitchen lisinopril (PRINIVIL,ZESTRIL) 10 MG tablet Take 10 mg by mouth daily.  . metoprolol succinate (TOPROL-XL) 25 MG 24 hr tablet Take 0.5 tablets (12.5 mg total) by mouth daily.  . Multiple Vitamin (MULTIVITAMIN) tablet Take 1 tablet by mouth daily.  . nitroGLYCERIN (NITROSTAT) 0.4 MG SL tablet Place 0.4 mg under the  tongue every 5 (five) minutes as needed. (Patient not taking: Reported on 09/07/2019)   No facility-administered encounter medications on file as of 09/07/2019.    Activities of Daily Living In your present state of health, do you have any difficulty performing the following activities: 09/07/2019  Hearing? Y  Comment wears hearing aids.  Vision? N  Comment wears reading glasses.  Difficulty concentrating or making decisions? N  Walking or climbing stairs? N  Dressing or bathing? N  Doing errands, shopping? N  Preparing Food and eating ? N  Using the Toilet? N  In the past six months, have you accidently leaked urine? N  Do you have problems with  loss of bowel control? N  Managing your Medications? N  Managing your Finances? N  Housekeeping or managing your Housekeeping? N  Some recent data might be hidden    Patient Care Team: Colon Branch, MD as PCP - General Rohrbeck, Revonda Standard., MD (Cardiology) Festus Aloe, MD as Consulting Physician (Urology) Vickii Chafe., DO as Consulting Physician (Cardiology)   Assessment:   This is a routine wellness examination for Koji. Physical assessment deferred to PCP.  Exercise Activities and Dietary recommendations Current Exercise Habits: Home exercise routine, Type of exercise: strength training/weights, Time (Minutes): 30, Frequency (Times/Week): 2, Weekly Exercise (Minutes/Week): 60, Intensity: Mild, Exercise limited by: None identified Diet (meal preparation, eat out, water intake, caffeinated beverages, dairy products, fruits and vegetables): in general, a "healthy" diet  , well balanced  Goals    . Maintain current health and independence       Fall Risk Fall Risk  09/07/2019 09/03/2018 02/18/2018 02/09/2018 03/07/2017  Falls in the past year? 0 0 0 0 No  Comment - - - Emmi Telephone Survey: data to providers prior to load -  Number falls in past yr: 0 - 0 - -  Injury with Fall? 0 - 0 - -  Follow up Education provided;Falls  prevention discussed - Falls evaluation completed - -   Depression Screen PHQ 2/9 Scores 09/07/2019 09/03/2018 02/24/2018 03/07/2017  PHQ - 2 Score 0 0 0 0  PHQ- 9 Score - - 0 -    Cognitive Function Ad8 score reviewed for issues:  Issues making decisions:no  Less interest in hobbies / activities: no  Repeats questions, stories (family complaining):no  Trouble using ordinary gadgets (microwave, computer, phone): no  Forgets the month or year:  no  Mismanaging finances: no  Remembering appts:no Daily problems with thinking and/or memory:no Ad8 score is=0  MMSE - Mini Mental State Exam 03/07/2017  Orientation to time 5  Orientation to Place 5  Registration 3  Attention/ Calculation 5  Recall 0  Language- name 2 objects 2  Language- repeat 1  Language- follow 3 step command 3  Language- read & follow direction 1  Write a sentence 1  Copy design 1  Total score 27        Immunization History  Administered Date(s) Administered  . Influenza Split 02/21/2011, 01/29/2012  . Influenza Whole 12/16/2007, 01/05/2009, 01/08/2010  . Influenza, High Dose Seasonal PF 01/17/2015, 11/21/2015, 12/16/2016  . Influenza,inj,Quad PF,6+ Mos 12/22/2012, 12/09/2013  . Influenza-Unspecified 12/29/2017  . PFIZER SARS-COV-2 Vaccination 05/21/2019, 06/15/2019  . Pneumococcal Conjugate-13 01/17/2015  . Pneumococcal Polysaccharide-23 05/19/2009  . Td 03/07/2017    Screening Tests Health Maintenance  Topic Date Due  . INFLUENZA VACCINE  10/24/2019  . TETANUS/TDAP  03/08/2027  . COVID-19 Vaccine  Completed  . PNA vac Low Risk Adult  Completed       Plan:    Please schedule your next medicare wellness visit with me in 1 yr.  Continue to eat heart healthy diet (full of fruits, vegetables, whole grains, lean protein, water--limit salt, fat, and sugar intake) and increase physical activity as tolerated.  Continue doing brain stimulating activities (puzzles, reading, adult coloring books,  staying active) to keep memory sharp.   Bring a copy of your living will and/or healthcare power of attorney to your next office visit.   I have personally reviewed and noted the following in the patient's chart:   . Medical and social history . Use of alcohol, tobacco or illicit drugs  .  Current medications and supplements . Functional ability and status . Nutritional status . Physical activity . Advanced directives . List of other physicians . Hospitalizations, surgeries, and ER visits in previous 12 months . Vitals . Screenings to include cognitive, depression, and falls . Referrals and appointments  In addition, I have reviewed and discussed with patient certain preventive protocols, quality metrics, and best practice recommendations. A written personalized care plan for preventive services as well as general preventive health recommendations were provided to patient.   Due to this being a telephonic visit, the after visit summary with patients personalized plan was offered to patient via mail or my-chart. Patient preferred to pick up at office at next visit.   [place]on [place], [place]  09/07/2019

## 2019-09-07 ENCOUNTER — Encounter: Payer: Self-pay | Admitting: *Deleted

## 2019-09-07 ENCOUNTER — Ambulatory Visit (INDEPENDENT_AMBULATORY_CARE_PROVIDER_SITE_OTHER): Payer: Medicare Other | Admitting: *Deleted

## 2019-09-07 ENCOUNTER — Other Ambulatory Visit: Payer: Self-pay

## 2019-09-07 DIAGNOSIS — Z0000 Encounter for general adult medical examination without abnormal findings: Secondary | ICD-10-CM

## 2019-09-07 NOTE — Patient Instructions (Signed)
Please schedule your next medicare wellness visit with me in 1 yr.  Continue to eat heart healthy diet (full of fruits, vegetables, whole grains, lean protein, water--limit salt, fat, and sugar intake) and increase physical activity as tolerated.  Continue doing brain stimulating activities (puzzles, reading, adult coloring books, staying active) to keep memory sharp.   Bring a copy of your living will and/or healthcare power of attorney to your next office visit.   [name] , Thank you for taking time to come for your Medicare Wellness Visit. I appreciate your ongoing commitment to your health goals. Please review the following plan we discussed and let me know if I can assist you in the future.   These are the goals we discussed: Goals    . Maintain current health and independence       This is a list of the screening recommended for you and due dates:  Health Maintenance  Topic Date Due  . Flu Shot  10/24/2019  . Tetanus Vaccine  03/08/2027  . COVID-19 Vaccine  Completed  . Pneumonia vaccines  Completed  '  Preventive Care 55 Years and Older, Male Preventive care refers to lifestyle choices and visits with your health care provider that can promote health and wellness. This includes:  A yearly physical exam. This is also called an annual well check.  Regular dental and eye exams.  Immunizations.  Screening for certain conditions.  Healthy lifestyle choices, such as diet and exercise. What can I expect for my preventive care visit? Physical exam Your health care provider will check:  Height and weight. These may be used to calculate body mass index (BMI), which is a measurement that tells if you are at a healthy weight.  Heart rate and blood pressure.  Your skin for abnormal spots. Counseling Your health care provider may ask you questions about:  Alcohol, tobacco, and drug use.  Emotional well-being.  Home and relationship well-being.  Sexual  activity.  Eating habits.  History of falls.  Memory and ability to understand (cognition).  Work and work Statistician. What immunizations do I need?  Influenza (flu) vaccine  This is recommended every year. Tetanus, diphtheria, and pertussis (Tdap) vaccine  You may need a Td booster every 10 years. Varicella (chickenpox) vaccine  You may need this vaccine if you have not already been vaccinated. Zoster (shingles) vaccine  You may need this after age 96. Pneumococcal conjugate (PCV13) vaccine  One dose is recommended after age 25. Pneumococcal polysaccharide (PPSV23) vaccine  One dose is recommended after age 83. Measles, mumps, and rubella (MMR) vaccine  You may need at least one dose of MMR if you were born in 1957 or later. You may also need a second dose. Meningococcal conjugate (MenACWY) vaccine  You may need this if you have certain conditions. Hepatitis A vaccine  You may need this if you have certain conditions or if you travel or work in places where you may be exposed to hepatitis A. Hepatitis B vaccine  You may need this if you have certain conditions or if you travel or work in places where you may be exposed to hepatitis B. Haemophilus influenzae type b (Hib) vaccine  You may need this if you have certain conditions. You may receive vaccines as individual doses or as more than one vaccine together in one shot (combination vaccines). Talk with your health care provider about the risks and benefits of combination vaccines. What tests do I need? Blood tests  Lipid  and cholesterol levels. These may be checked every 5 years, or more frequently depending on your overall health.  Hepatitis C test.  Hepatitis B test. Screening  Lung cancer screening. You may have this screening every year starting at age 47 if you have a 30-pack-year history of smoking and currently smoke or have quit within the past 15 years.  Colorectal cancer screening. All adults  should have this screening starting at age 83 and continuing until age 39. Your health care provider may recommend screening at age 37 if you are at increased risk. You will have tests every 1-10 years, depending on your results and the type of screening test.  Prostate cancer screening. Recommendations will vary depending on your family history and other risks.  Diabetes screening. This is done by checking your blood sugar (glucose) after you have not eaten for a while (fasting). You may have this done every 1-3 years.  Abdominal aortic aneurysm (AAA) screening. You may need this if you are a current or former smoker.  Sexually transmitted disease (STD) testing. Follow these instructions at home: Eating and drinking  Eat a diet that includes fresh fruits and vegetables, whole grains, lean protein, and low-fat dairy products. Limit your intake of foods with high amounts of sugar, saturated fats, and salt.  Take vitamin and mineral supplements as recommended by your health care provider.  Do not drink alcohol if your health care provider tells you not to drink.  If you drink alcohol: ? Limit how much you have to 0-2 drinks a day. ? Be aware of how much alcohol is in your drink. In the [place] one drink equals one 12 oz bottle of beer (355 mL), one 5 oz glass of wine (148 mL), or one 1 oz glass of hard liquor (44 mL). Lifestyle  Take daily care of your teeth and gums.  Stay active. Exercise for at least 30 minutes on 5 or more days each week.  Do not use any products that contain nicotine or tobacco, such as cigarettes, e-cigarettes, and chewing tobacco. If you need help quitting, ask your health care provider.  If you are sexually active, practice safe sex. Use a condom or other form of protection to prevent STIs (sexually transmitted infections).  Talk with your health care provider about taking a low-dose aspirin or statin. What's next?  Visit your health care provider once a year  for a well check visit.  Ask your health care provider how often you should have your eyes and teeth checked.  Stay up to date on all vaccines. This information is not intended to replace advice given to you by your health care provider. Make sure you discuss any questions you have with your health care provider. Document Revised: 03/05/2018 Document Reviewed: 03/05/2018 Elsevier Patient Education  2020 [employer].

## 2019-09-09 ENCOUNTER — Other Ambulatory Visit: Payer: Self-pay | Admitting: Internal Medicine

## 2019-09-14 ENCOUNTER — Other Ambulatory Visit: Payer: Self-pay

## 2019-09-14 ENCOUNTER — Encounter: Payer: Self-pay | Admitting: Internal Medicine

## 2019-09-14 ENCOUNTER — Ambulatory Visit (INDEPENDENT_AMBULATORY_CARE_PROVIDER_SITE_OTHER): Payer: Medicare Other | Admitting: Internal Medicine

## 2019-09-14 VITALS — BP 164/80 | HR 44 | Temp 96.9°F | Resp 18 | Ht 72.0 in | Wt 167.2 lb

## 2019-09-14 DIAGNOSIS — E785 Hyperlipidemia, unspecified: Secondary | ICD-10-CM

## 2019-09-14 DIAGNOSIS — I10 Essential (primary) hypertension: Secondary | ICD-10-CM

## 2019-09-14 DIAGNOSIS — I480 Paroxysmal atrial fibrillation: Secondary | ICD-10-CM

## 2019-09-14 MED ORDER — HYDROCHLOROTHIAZIDE 12.5 MG PO TABS: 12.5000 mg | ORAL_TABLET | Freq: Every day | ORAL | 1 refills | Status: DC

## 2019-09-14 NOTE — Progress Notes (Signed)
Subjective:    Patient ID: Adam Buchanan., male    DOB: Mar 02, 1935, 85 y.o.   MRN: [id number]  DOS:  09/14/2019 Type of visit - description: Follow-up Since the last office visit he is doing well, good med compliance, we reviewed together his ambulatory BPs.   Review of Systems Denies chest pain, difficulty breathing. Lower extremity edema at baseline, decreased with leg elevation.   Past Medical History:  Diagnosis Date   Atrial fibrillation (HCC) 02/2015   BPH (benign prostatic hyperplasia)    CAD (coronary artery disease) 08/09/2011   CAD, MI 07-2010, stents, then CABG 9-12  Cards -- f/u in [place]     ELEVATED BLOOD PRESSURE WITHOUT DIAGNOSIS OF HYPERTENSION 05/19/2009   Qualifier: Diagnosis of  By: Drue Novel MD, Nolon Rod.    History of kidney stones    Hyperlipidemia    Mitral regurgitation 2012   seen on echo    Past Surgical History:  Procedure Laterality Date   CARDIOVERSION  2012   CORONARY ARTERY BYPASS GRAFT     (x5) 12-10-2010   EXTRACORPOREAL SHOCK WAVE LITHOTRIPSY Left 11/06/2017   Procedure: LEFT EXTRACORPOREAL SHOCK WAVE LITHOTRIPSY (ESWL);  Surgeon: Sebastian Ache, MD;  Location: [facility];  Service: Urology;  Laterality: Left;   FACIAL FRACTURE SURGERY     TONSILECTOMY, ADENOIDECTOMY, BILATERAL MYRINGOTOMY AND TUBES      Allergies as of 09/14/2019   No Known Allergies     Medication List       Accurate as of September 14, 2019 10:54 AM. If you have any questions, ask your nurse or doctor.        apixaban 2.5 MG Tabs tablet Commonly known as: Eliquis Take 1 tablet (2.5 mg total) by mouth 2 (two) times daily.   atorvastatin 80 MG tablet Commonly known as: LIPITOR Take 1 tablet (80 mg total) by mouth daily.   finasteride 5 MG tablet Commonly known as: PROSCAR Take 5 mg by mouth daily.   hydrochlorothiazide 12.5 MG tablet Commonly known as: HYDRODIURIL Take 1 tablet (12.5 mg total) by mouth daily.   lisinopril 10 MG tablet Commonly known as:  ZESTRIL Take 10 mg by mouth daily.   metoprolol succinate 25 MG 24 hr tablet Commonly known as: TOPROL-XL Take 0.5 tablets (12.5 mg total) by mouth daily.   multivitamin tablet Take 1 tablet by mouth daily.   nitroGLYCERIN 0.4 MG SL tablet Commonly known as: NITROSTAT Place 0.4 mg under the tongue every 5 (five) minutes as needed.          Objective:   Physical Exam BP (!) 164/80 (BP Location: Left Arm, Patient Position: Sitting, Cuff Size: Small)    Pulse (!) 44    Temp (!) 96.9 F (36.1 C) (Temporal)    Resp 18    Ht 6' (1.829 m)    Wt 167 lb 4 oz (75.9 kg)    SpO2 98%    BMI 22.68 kg/m  General:   Well developed, NAD but seems a slightly more frail than before, BMI noted. HEENT:  Normocephalic . Face symmetric, atraumatic Lungs:  CTA B Normal respiratory effort, no intercostal retractions, no accessory muscle use. Heart: Bradycardic,  no murmur.  Lower extremities: +/+++ pretibial edema bilaterally  Skin: Not pale. Not jaundice Neurologic:  alert & oriented X3.  Speech normal, gait unassisted Psych--  Cognition and judgment appear intact.  Cooperative with normal attention span and concentration.  Behavior appropriate. No anxious or depressed appearing.  Assessment    Assessment HTN Hyperlipidemia CRI  creat ~ 1.6,  US renal 2015 no obstruction  CV:  Cardiology @ [place] CAD: MI, stents 07-2010. CABG 11-2010. Atrial fib -- cardioversion 2012, on amiodarone  MR per echo  11-2010, no murmur, no need to repeat echo per cardiology note Ectatic aorta per ultrasound 02/2019, recheck 5 years GU: --BPH, Elevated PSA reportedly had a bx Dr Terance Hart 2012 , 05-2013 PSA 12.11 -Hematuria, CT and cystoscopy late  2018, both ok, +urolithiasis( Dr. Junious Silk).  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 Neuropathy : saw neuro 01/2018 (" examination shows a distal predominant large fiber peripheral  neuropathy" pt declined further labs/tests)  PLAN:  HTN: Since the last office visit, HCTZ was added, subsequent BMP was very good.  Also on metoprolol & lisinopril;  BP today is a slightly elevated but at home Is consistently in the 134/73 and at times even 106/69's.  Heart rate is in the 50s. Plan: No change, continue monitoring BPs.  RF hydrochlorothiazide Hyperlipidemia: Lipitor: Well-controlled. Atrial fibrillation: Anticoagulated, rate controlled.  Asymptomatic. Overall seems a slightly more frail, reassess on RTC. Preventive care: Had a Covid vaccination, encourage flu shot this fall. Social: He planned to move to [place] but plan has been delayed. RTC 4 months 4-21     This visit occurred during the SARS-CoV-2 public health emergency.  Safety protocols were in place, including screening questions prior to the visit, additional usage of staff PPE, and extensive cleaning of exam room while observing appropriate contact time as indicated for disinfecting solutions.

## 2019-09-14 NOTE — Patient Instructions (Addendum)
Check the  blood pressure regulalrly BP GOAL is between 110/65 and  135/85. If it is consistently higher or lower, let me know     GO TO THE FRONT DESK, PLEASE SCHEDULE YOUR APPOINTMENTS Come back for a check up in 4 months

## 2019-09-14 NOTE — Progress Notes (Signed)
Pre visit review using our clinic review tool, if applicable. No additional management support is needed unless otherwise documented below in the visit note. 

## 2019-09-15 NOTE — Assessment & Plan Note (Signed)
HTN: Since the last office visit, HCTZ was added, subsequent BMP was very good.  Also on metoprolol & lisinopril;  BP today is a slightly elevated but at home Is consistently in the 134/73 and at times even 106/69's.  Heart rate is in the 50s. Plan: No change, continue monitoring BPs.  RF hydrochlorothiazide Hyperlipidemia: Lipitor: Well-controlled. Atrial fibrillation: Anticoagulated, rate controlled.  Asymptomatic. Overall seems a slightly more frail, reassess on RTC. Preventive care: Had a Covid vaccination, encourage flu shot this fall. Social: He planned to move to [place] but plan has been delayed. RTC 4 months 4-21

## 2019-09-20 DIAGNOSIS — N400 Enlarged prostate without lower urinary tract symptoms: Secondary | ICD-10-CM | POA: Diagnosis not present

## 2019-10-05 ENCOUNTER — Other Ambulatory Visit: Payer: Self-pay | Admitting: Internal Medicine

## 2019-12-24 DIAGNOSIS — K4090 Unilateral inguinal hernia, without obstruction or gangrene, not specified as recurrent: Secondary | ICD-10-CM | POA: Diagnosis not present

## 2020-01-03 IMAGING — CT CT RENAL STONE PROTOCOL
2 of 4 series · 16 of 46 positions shown, 18 images · non-contrast
Comparison: CT scan of January 29, 2017.

CLINICAL DATA: Acute left lower quadrant abdominal pain.

EXAM:
CT ABDOMEN AND PELVIS WITHOUT CONTRAST
TECHNIQUE: Multidetector CT imaging of the abdomen and pelvis was performed
following the standard protocol without IV contrast.

[Series 3: ap without · axial · non-contrast · 0.67mm/px · z∈[+909,+1309]mm · 13 of 91 slices shown, 15 images]
[im 6/91  soft-tissue]
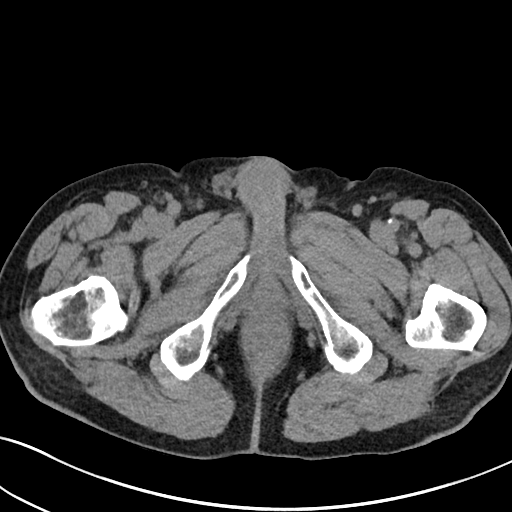
[im 6/91  bone]
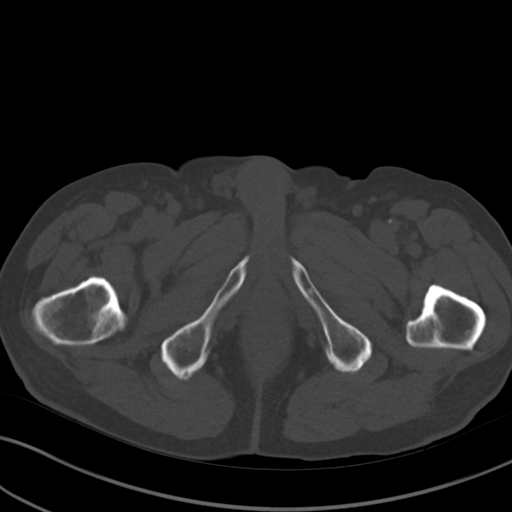
[im 11/91  soft-tissue]
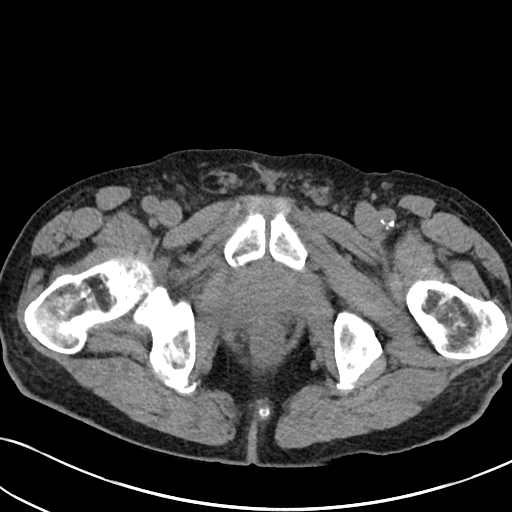
[im 21/91  soft-tissue]
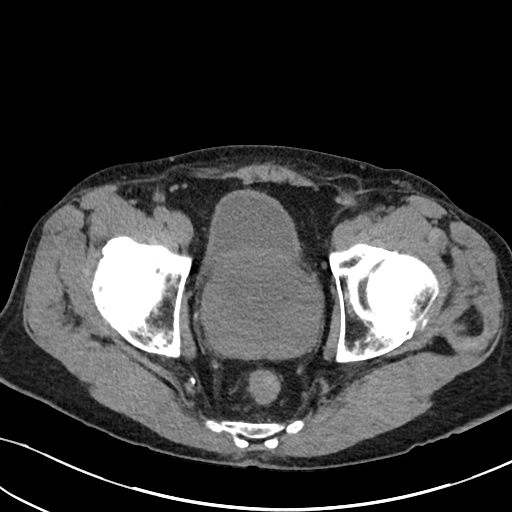
[im 26/91  soft-tissue]
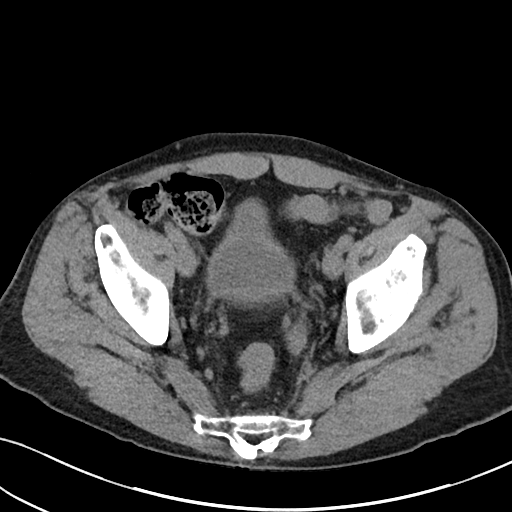
[im 31/91  soft-tissue]
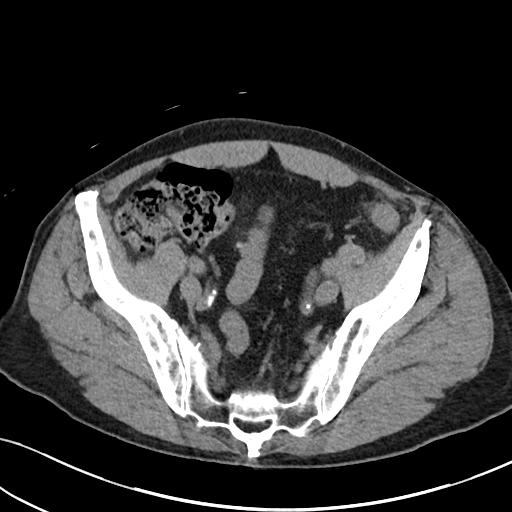
[im 41/91  soft-tissue]
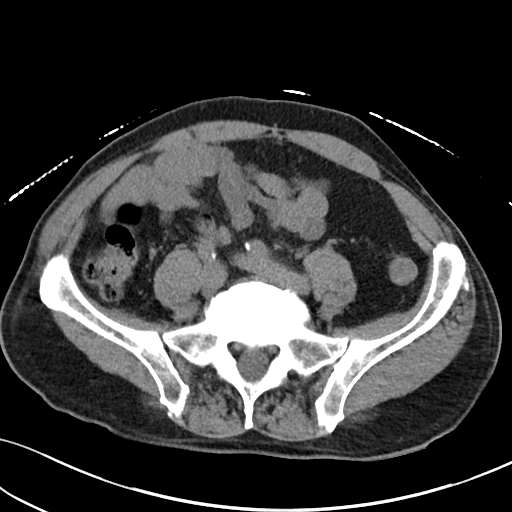
[im 46/91  soft-tissue]
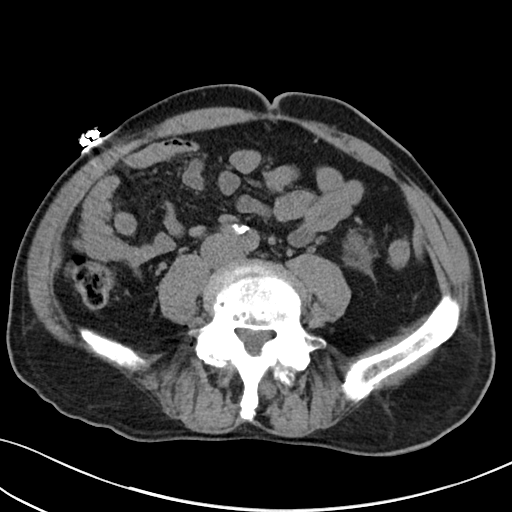
[im 51/91  soft-tissue]
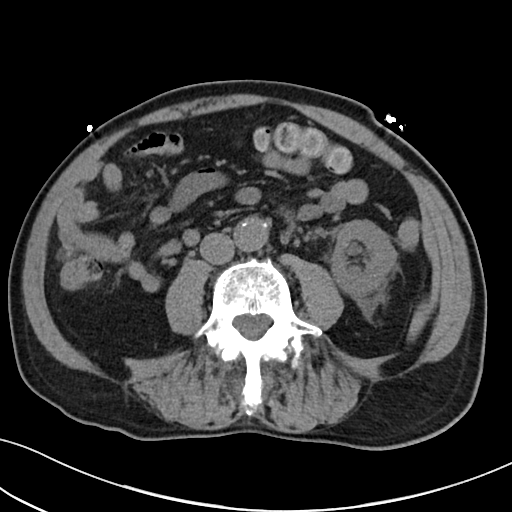
[im 61/91  soft-tissue]
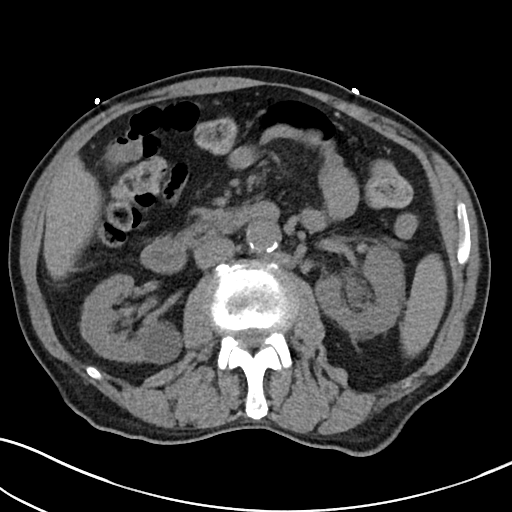
[im 61/91  bone]
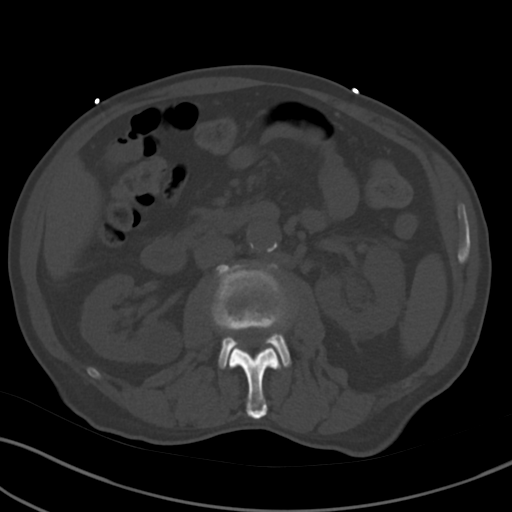
[im 66/91  soft-tissue]
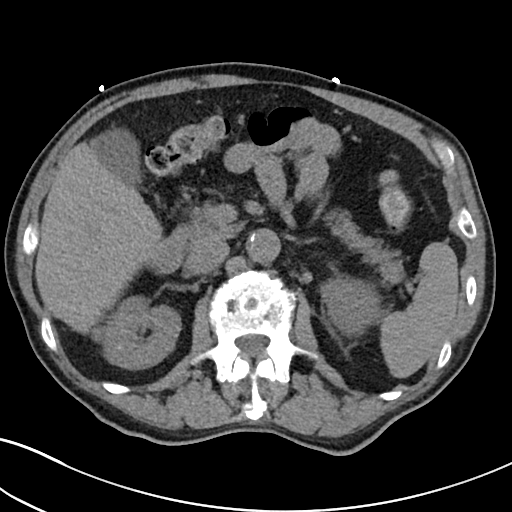
[im 71/91  soft-tissue]
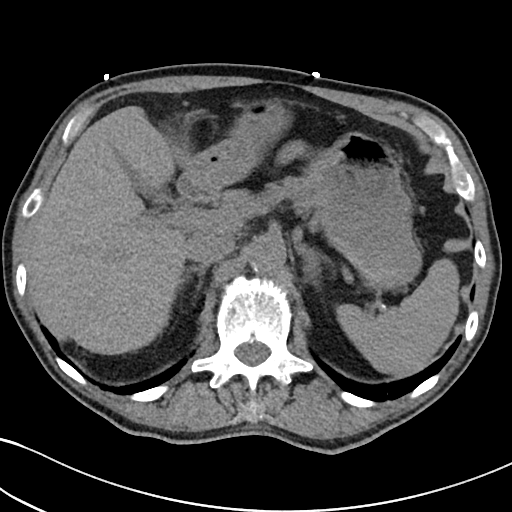
[im 81/91  soft-tissue]
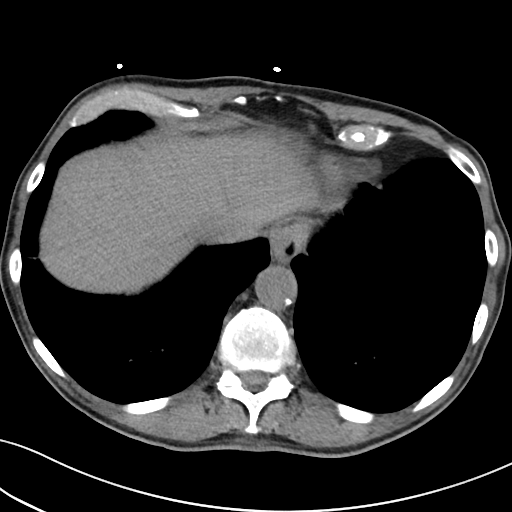
[im 86/91  soft-tissue]
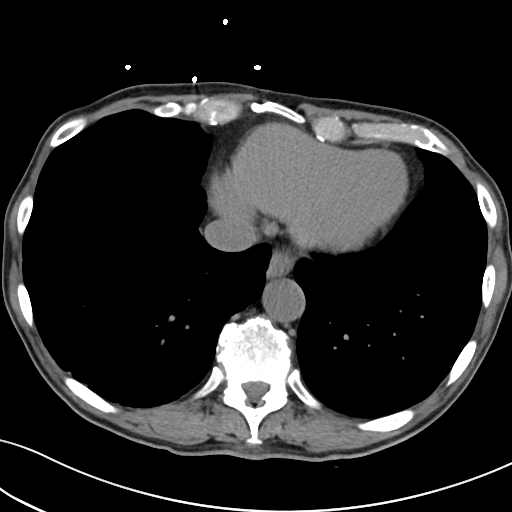

[Series 6: cor · coronal · 0.74mm/px · 3 of 93 slices shown]
[im 31/93  soft-tissue]
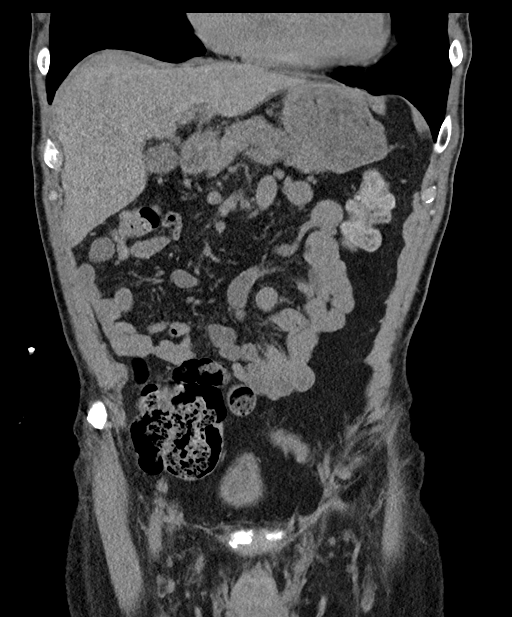
[im 41/93  soft-tissue]
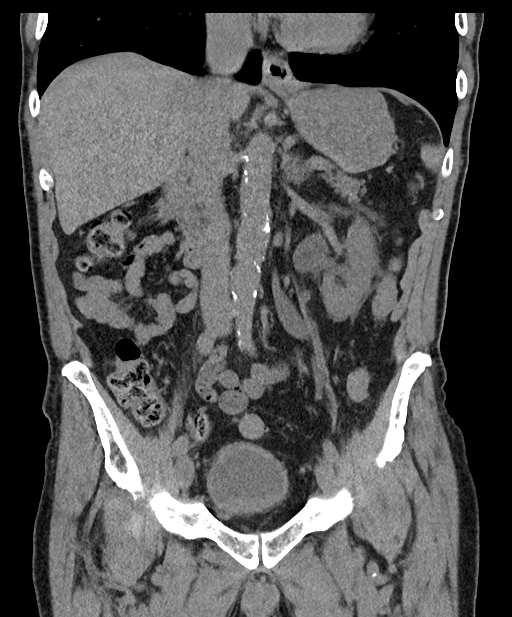
[im 52/93  soft-tissue]
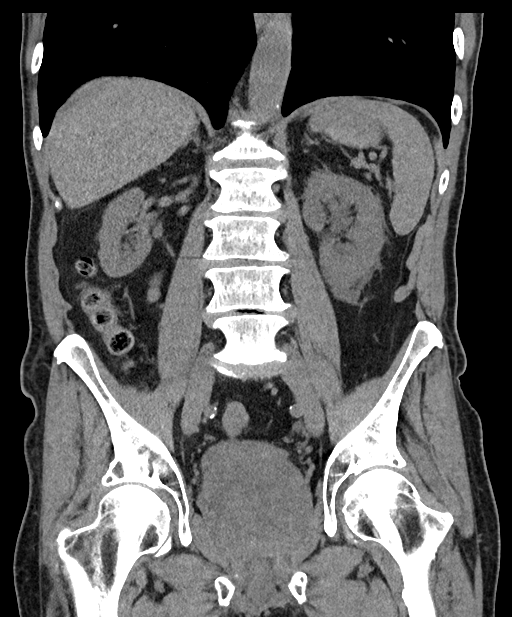

[16 of 46 positions shown; findings below may reference images not displayed]

FINDINGS: Lower chest: No acute abnormality.

Hepatobiliary: Mild cholelithiasis is noted without inflammation. No
biliary dilatation is noted. No abnormality is seen in the liver on
these unenhanced images.

Pancreas: Unremarkable. No pancreatic ductal dilatation or
surrounding inflammatory changes.

Spleen: Normal in size without focal abnormality.

Adrenals/Urinary Tract: Adrenal glands appear normal. Stable right
renal cyst is noted. 13 mm calculus is seen in left lower
infundibulum. Moderate left hydroureteronephrosis is noted with
perinephric stranding without obstructing calculus. Urinary bladder
is unremarkable.

Stomach/Bowel: Stomach is within normal limits. Appendix appears
normal. No evidence of bowel wall thickening, distention, or
inflammatory changes.

Vascular/Lymphatic: Aortic atherosclerosis. No enlarged abdominal or
pelvic lymph nodes.

Reproductive: Stable severe prostatic enlargement is noted.

Other: No abdominal wall hernia or abnormality. No abdominopelvic
ascites.

Musculoskeletal: Multilevel degenerative disc disease is noted in
the lumbar spine. No acute osseous abnormality is noted.
IMPRESSION: 13 mm nonobstructive calculus is seen in inferior infundibulum of
left intrarenal collecting system. Moderate left hydronephrosis is
noted with perinephric stranding without evidence of obstructing
ureteral calculus. Potentially this may be due to distal ureteral
spasm from recently passed stone.

Mild cholelithiasis is noted without inflammation.

Stable severe prostatic enlargement is noted.

Multilevel degenerative disc disease is noted in the lumbar spine.

Aortic Atherosclerosis (IE1HD-R16.6).

## 2020-01-14 ENCOUNTER — Ambulatory Visit (INDEPENDENT_AMBULATORY_CARE_PROVIDER_SITE_OTHER): Payer: Medicare Other | Admitting: Internal Medicine

## 2020-01-14 ENCOUNTER — Other Ambulatory Visit: Payer: Self-pay

## 2020-01-14 ENCOUNTER — Encounter: Payer: Self-pay | Admitting: Internal Medicine

## 2020-01-14 VITALS — BP 147/78 | HR 45 | Temp 97.8°F | Resp 18 | Ht 72.0 in | Wt 164.0 lb

## 2020-01-14 DIAGNOSIS — Z23 Encounter for immunization: Secondary | ICD-10-CM

## 2020-01-14 DIAGNOSIS — I25810 Atherosclerosis of coronary artery bypass graft(s) without angina pectoris: Secondary | ICD-10-CM | POA: Diagnosis not present

## 2020-01-14 DIAGNOSIS — Z7185 Encounter for immunization safety counseling: Secondary | ICD-10-CM

## 2020-01-14 DIAGNOSIS — I480 Paroxysmal atrial fibrillation: Secondary | ICD-10-CM | POA: Diagnosis not present

## 2020-01-14 DIAGNOSIS — I10 Essential (primary) hypertension: Secondary | ICD-10-CM

## 2020-01-14 NOTE — Patient Instructions (Addendum)
Check the  blood pressure regularly BP GOAL is between 110/65 and  135/85. If it is consistently higher or lower, let me know   Today you are getting a flu shot and also a pneumonia shot  In a couple of weeks get your Covid vaccine booster at your local pharmacy   GO TO THE LAB : Get the blood work     GO TO THE FRONT DESK, PLEASE SCHEDULE YOUR APPOINTMENTS Come back for a checkup in 4 to 5 months

## 2020-01-14 NOTE — Progress Notes (Signed)
Pre visit review using our clinic review tool, if applicable. No additional management support is needed unless otherwise documented below in the visit note. 

## 2020-01-14 NOTE — Progress Notes (Signed)
Subjective:    Patient ID: Adam Buchanan., male    DOB: 06-29-1934, 85 y.o.   MRN: [id number]  DOS:  01/14/2020 Type of visit - description: Routine office visit In general feels well. Has no new concerns. He takes care of disabled son, he is very busy but he seems to be holding his ground.  Wt Readings from Last 3 Encounters:  01/14/20 164 lb (74.4 kg)  09/14/19 167 lb 4 oz (75.9 kg)  07/14/19 175 lb 4 oz (79.5 kg)     Review of Systems Denies chest pain no difficulty breathing, lower extremity edema is at baseline, typically worse at the end of the day  Past Medical History:  Diagnosis Date  . Atrial fibrillation (HCC) 02/2015  . BPH (benign prostatic hyperplasia)   . CAD (coronary artery disease) 08/09/2011   CAD, MI 07-2010, stents, then CABG 9-12  Cards -- f/u in [place]    . ELEVATED BLOOD PRESSURE WITHOUT DIAGNOSIS OF HYPERTENSION 05/19/2009   Qualifier: Diagnosis of  By: Drue Novel MD, Nolon Rod.   . History of kidney stones   . Hyperlipidemia   . Mitral regurgitation 2012   seen on echo    Past Surgical History:  Procedure Laterality Date  . CARDIOVERSION  2012  . CORONARY ARTERY BYPASS GRAFT     (x5) 12-10-2010  . EXTRACORPOREAL SHOCK WAVE LITHOTRIPSY Left 11/06/2017   Procedure: LEFT EXTRACORPOREAL SHOCK WAVE LITHOTRIPSY (ESWL);  Surgeon: Sebastian Ache, MD;  Location: [facility];  Service: Urology;  Laterality: Left;  . FACIAL FRACTURE SURGERY    . TONSILECTOMY, ADENOIDECTOMY, BILATERAL MYRINGOTOMY AND TUBES      Allergies as of 01/14/2020   No Known Allergies     Medication List       Accurate as of January 14, 2020 11:59 PM. If you have any questions, ask your nurse or doctor.        apixaban 2.5 MG Tabs tablet Commonly known as: Eliquis Take 1 tablet (2.5 mg total) by mouth 2 (two) times daily.   atorvastatin 80 MG tablet Commonly known as: LIPITOR Take 1 tablet (80 mg total) by mouth daily.   finasteride 5 MG tablet Commonly known as: PROSCAR Take 5 mg  by mouth daily.   hydrochlorothiazide 12.5 MG tablet Commonly known as: HYDRODIURIL Take 1 tablet (12.5 mg total) by mouth daily.   lisinopril 10 MG tablet Commonly known as: ZESTRIL Take 10 mg by mouth daily.   metoprolol succinate 25 MG 24 hr tablet Commonly known as: TOPROL-XL Take 0.5 tablets (12.5 mg total) by mouth daily.   multivitamin tablet Take 1 tablet by mouth daily.   nitroGLYCERIN 0.4 MG SL tablet Commonly known as: NITROSTAT Place 0.4 mg under the tongue every 5 (five) minutes as needed.          Objective:   Physical Exam BP (!) 147/78 (BP Location: Left Arm, Patient Position: Sitting, Cuff Size: Small)   Pulse (!) 45   Temp 97.8 F (36.6 C) (Oral)   Resp 18   Ht 6' (1.829 m)   Wt 164 lb (74.4 kg)   SpO2 96%   BMI 22.24 kg/m  General:   Well developed, NAD, BMI noted. HEENT:  Normocephalic . Face symmetric, atraumatic Lungs:  CTA B Normal respiratory effort, no intercostal retractions, no accessory muscle use. Heart: Bradycardic.  Lower extremities: +/+++ pretibial edema bilaterally  Skin: Not pale. Not jaundice Neurologic:  alert & oriented X3.  Speech normal, gait appropriate for age  and unassisted Psych--  Cognition and judgment appear intact.  Cooperative with normal attention span and concentration.  Behavior appropriate. No anxious or depressed appearing.       Assessment     Assessment HTN Hyperlipidemia CRI  creat ~ 1.6,  US renal 2015 no obstruction  CV:  Cardiology @ [place] CAD: MI, stents 07-2010. CABG 11-2010. Atrial fib -- cardioversion 2012, on amiodarone  MR per echo  11-2010, no murmur, no need to repeat echo per cardiology note Ectatic aorta per ultrasound 02/2019, recheck 5 years GU: --BPH, Elevated PSA reportedly had a bx Dr Vonita Moss 2012 , 05-2013 PSA 12.11 -Hematuria, CT and cystoscopy late  2018, both ok, +urolithiasis( Dr. Mena Goes).  Rx finasteride Dilated abd aorta per [place]  05-2013, repeat ultrasound 5  years Osteopenia: T score -1.1 (01-2016). 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 On HCTZ, lisinopril, metoprolol. Check a BMP CAD, A. fib: No chest pain no difficulty breathing, anticoagulated, rate controlled, sees cardiology regularly. Edema is at baseline. Weight has not increased. No change BPH, elevated PSA: Reports that he saw urology recently. Preventive care: Flu shot today, PNM 23 today, recommend booster for Covid vaccine in a couple of weeks Frail: See my last note, he does not seem to be more frail. Denies any fall, still drives and take care of his disabled son. Social: Since the last visit he found out if he has a daughter (through [email or web address]) who lives in [place]. Also has a daughter in [place], he continue planning to move to [place] at some point. RTC 4 to 5 months     This visit occurred during the SARS-CoV-2 public health emergency.  Safety protocols were in place, including screening questions prior to the visit, additional usage of staff PPE, and extensive cleaning of exam room while observing appropriate contact time as indicated for disinfecting solutions.

## 2020-01-15 LAB — BASIC METABOLIC PANEL
BUN/Creatinine Ratio: 18 (calc) (ref 6–22)
BUN: 29 mg/dL — ABNORMAL HIGH (ref 7–25)
CO2: 27 mmol/L (ref 20–32)
Calcium: 10 mg/dL (ref 8.6–10.3)
Chloride: 107 mmol/L (ref 98–110)
Creat: 1.59 mg/dL — ABNORMAL HIGH (ref 0.70–1.11)
Glucose, Bld: 84 mg/dL (ref 65–99)
Potassium: 5 mmol/L (ref 3.5–5.3)
Sodium: 141 mmol/L (ref 135–146)

## 2020-01-16 NOTE — Assessment & Plan Note (Signed)
[id number] BPs in the 130/70. On HCTZ, lisinopril, metoprolol. Check a BMP CAD, A. fib: No chest pain no difficulty breathing, anticoagulated, rate controlled, sees cardiology regularly. Edema is at baseline. Weight has not increased. No change BPH, elevated PSA: Reports that he saw urology recently. Preventive care: Flu shot today, PNM 23 today, recommend booster for Covid vaccine in a couple of weeks Frail: See my last note, he does not seem to be more frail. Denies any fall, still drives and take care of his disabled son. Social: Since the last visit he found out if he has a daughter (through [email or web address]) who lives in [place]. Also has a daughter in [place], he continue planning to move to [place] at some point. RTC 4 to 5 months

## 2020-02-15 DIAGNOSIS — Z23 Encounter for immunization: Secondary | ICD-10-CM | POA: Diagnosis not present

## 2020-02-28 ENCOUNTER — Other Ambulatory Visit: Payer: Self-pay

## 2020-02-28 MED ORDER — NITROGLYCERIN 0.4 MG SL SUBL: 0.4000 mg | SUBLINGUAL_TABLET | SUBLINGUAL | 3 refills | Status: AC | PRN

## 2020-03-26 ENCOUNTER — Other Ambulatory Visit: Payer: Self-pay | Admitting: Internal Medicine

## 2020-03-27 ENCOUNTER — Telehealth: Payer: Self-pay

## 2020-03-27 NOTE — Telephone Encounter (Signed)
PA initiated via Covermymeds; KEY: [id number]. Awaiting determination.

## 2020-03-28 MED ORDER — RIVAROXABAN 15 MG PO TABS: 15.0000 mg | ORAL_TABLET | Freq: Every day | ORAL | 3 refills | Status: DC

## 2020-03-28 NOTE — Telephone Encounter (Signed)
PA denied. Preferred alternative is Xarelto.

## 2020-03-28 NOTE — Telephone Encounter (Signed)
Okay to change to Xarelto 15 mg daily.  Send a prescription (Less than the usual 20 mg qd due to his kidney insufficiency).

## 2020-03-28 NOTE — Telephone Encounter (Signed)
Spoke w/ Pt- informed of PA denial. Informed to switch to Xarelto 15mg  once daily. Rx sent to [facility] [address]

## 2020-05-03 DIAGNOSIS — H2513 Age-related nuclear cataract, bilateral: Secondary | ICD-10-CM | POA: Diagnosis not present

## 2020-05-03 DIAGNOSIS — H35372 Puckering of macula, left eye: Secondary | ICD-10-CM | POA: Diagnosis not present

## 2020-05-03 DIAGNOSIS — H43813 Vitreous degeneration, bilateral: Secondary | ICD-10-CM | POA: Diagnosis not present

## 2020-05-09 ENCOUNTER — Telehealth: Payer: Self-pay

## 2020-05-09 ENCOUNTER — Telehealth: Payer: Self-pay | Admitting: Internal Medicine

## 2020-05-09 ENCOUNTER — Other Ambulatory Visit: Payer: Self-pay | Admitting: Internal Medicine

## 2020-05-09 MED ORDER — APIXABAN 2.5 MG PO TABS: 2.5000 mg | ORAL_TABLET | Freq: Two times a day (BID) | ORAL | 3 refills | Status: AC

## 2020-05-09 NOTE — Telephone Encounter (Signed)
Please advise 

## 2020-05-09 NOTE — Telephone Encounter (Signed)
Patient states he will like to stop the Xarelto 15 MG Because it's causing him to have abnormal bleeding dizziness pt wants to get back on Eliquist because he states he  had no problem with the Eliquist  Please Advice

## 2020-05-09 NOTE — Telephone Encounter (Signed)
PA is pending.

## 2020-05-09 NOTE — Telephone Encounter (Signed)
Okay to go back on Eliquis 2.5 mg twice a day.  Send a new prescription if needed. If severe dizziness or bleeding needs to be seen.

## 2020-05-09 NOTE — Telephone Encounter (Signed)
Received further questions from [facility]. Questions answered and faxed back to [phone].

## 2020-05-09 NOTE — Telephone Encounter (Signed)
Patient states he is not able to afford Eliquis $500.00. He would like something more affordable.

## 2020-05-09 NOTE — Telephone Encounter (Signed)
PA initiated via Covermymeds; KEY: [id number]. Awaiting determination.

## 2020-05-09 NOTE — Telephone Encounter (Signed)
Spoke w/ Pt- informed to go back to Eliquis 2.5mg  bid, [facility]

## 2020-05-10 NOTE — Telephone Encounter (Signed)
PA approved. Effective 05/10/2020 to 05/10/2021.

## 2020-05-10 NOTE — Telephone Encounter (Signed)
[facility] informed of [facility] approval.  

## 2020-05-31 ENCOUNTER — Other Ambulatory Visit: Payer: Self-pay | Admitting: Internal Medicine

## 2020-06-13 ENCOUNTER — Encounter: Payer: Self-pay | Admitting: Internal Medicine

## 2020-06-13 ENCOUNTER — Other Ambulatory Visit: Payer: Self-pay

## 2020-06-13 ENCOUNTER — Ambulatory Visit (INDEPENDENT_AMBULATORY_CARE_PROVIDER_SITE_OTHER): Payer: Medicare Other | Admitting: Internal Medicine

## 2020-06-13 VITALS — BP 126/82 | HR 55 | Temp 98.0°F | Resp 18 | Ht 72.0 in | Wt 170.1 lb

## 2020-06-13 DIAGNOSIS — M810 Age-related osteoporosis without current pathological fracture: Secondary | ICD-10-CM

## 2020-06-13 DIAGNOSIS — I25810 Atherosclerosis of coronary artery bypass graft(s) without angina pectoris: Secondary | ICD-10-CM

## 2020-06-13 DIAGNOSIS — E785 Hyperlipidemia, unspecified: Secondary | ICD-10-CM | POA: Diagnosis not present

## 2020-06-13 DIAGNOSIS — I480 Paroxysmal atrial fibrillation: Secondary | ICD-10-CM | POA: Diagnosis not present

## 2020-06-13 DIAGNOSIS — N5089 Other specified disorders of the male genital organs: Secondary | ICD-10-CM

## 2020-06-13 DIAGNOSIS — I10 Essential (primary) hypertension: Secondary | ICD-10-CM | POA: Diagnosis not present

## 2020-06-13 LAB — LIPID PANEL
Cholesterol: 133 mg/dL (ref 0–200)
HDL: 50.1 mg/dL (ref >39.00)
LDL Cholesterol: 70 mg/dL (ref 0–99)
NonHDL: 83.16
Total CHOL/HDL Ratio: 3
Triglycerides: 65 mg/dL (ref 0.0–149.0)
VLDL: 13 mg/dL (ref 0.0–40.0)

## 2020-06-13 LAB — COMPREHENSIVE METABOLIC PANEL
ALT: 19 U/L (ref 0–53)
AST: 16 U/L (ref 0–37)
Albumin: 4.2 g/dL (ref 3.5–5.2)
Alkaline Phosphatase: 58 U/L (ref 39–117)
BUN: 31 mg/dL — ABNORMAL HIGH (ref 6–23)
CO2: 26 mEq/L (ref 19–32)
Calcium: 10 mg/dL (ref 8.4–10.5)
Chloride: 108 mEq/L (ref 96–112)
Creatinine, Ser: 1.6 mg/dL — ABNORMAL HIGH (ref 0.40–1.50)
GFR: 39.56 mL/min — ABNORMAL LOW (ref >60.00)
Glucose, Bld: 100 mg/dL — ABNORMAL HIGH (ref 70–99)
Potassium: 4.4 mEq/L (ref 3.5–5.1)
Sodium: 140 mEq/L (ref 135–145)
Total Bilirubin: 0.6 mg/dL (ref 0.2–1.2)
Total Protein: 6.6 g/dL (ref 6.0–8.3)

## 2020-06-13 LAB — CBC WITH DIFFERENTIAL/PLATELET
Basophils Absolute: 0 10*3/uL (ref 0.0–0.1)
Basophils Relative: 0.6 % (ref 0.0–3.0)
Eosinophils Absolute: 0.1 10*3/uL (ref 0.0–0.7)
Eosinophils Relative: 2.3 % (ref 0.0–5.0)
HCT: 40.7 % (ref 39.0–52.0)
Hemoglobin: 13.7 g/dL (ref 13.0–17.0)
Lymphocytes Relative: 26.3 % (ref 12.0–46.0)
Lymphs Abs: 1.4 10*3/uL (ref 0.7–4.0)
MCHC: 33.6 g/dL (ref 30.0–36.0)
MCV: 90.5 fl (ref 78.0–100.0)
Monocytes Absolute: 0.5 10*3/uL (ref 0.1–1.0)
Monocytes Relative: 8.4 % (ref 3.0–12.0)
Neutro Abs: 3.4 10*3/uL (ref 1.4–7.7)
Neutrophils Relative %: 62.4 % (ref 43.0–77.0)
Platelets: 161 10*3/uL (ref 150.0–400.0)
RBC: 4.5 Mil/uL (ref 4.22–5.81)
RDW: 14.3 % (ref 11.5–15.5)
WBC: 5.4 10*3/uL (ref 4.0–10.5)

## 2020-06-13 NOTE — Progress Notes (Unsigned)
Subjective:    Patient ID: Adam Buchanan., male    DOB: Nov 10, 1934, 86 y.o.   MRN: [id number]  DOS:  06/13/2020 Type of visit - description: Follow up  In general feels well. Still very busy taking care of his son who has a disability. Good med compliance  Denies chest pain no difficulty breathing.  No palpitation. Occasional lower extremity edema at the end of the day.  Not severe. No blood in the urine;  occasionally sees drops of blood in the toilet paper, history of hemorrhoids, no overt GI bleed.  Review of Systems See above   Past Medical History:  Diagnosis Date  . Atrial fibrillation (HCC) 02/2015  . BPH (benign prostatic hyperplasia)   . CAD (coronary artery disease) 08/09/2011   CAD, MI 07-2010, stents, then CABG 9-12  Cards -- f/u in [place]    . ELEVATED BLOOD PRESSURE WITHOUT DIAGNOSIS OF HYPERTENSION 05/19/2009   Qualifier: Diagnosis of  By: Drue Novel MD, Nolon Rod.   . History of kidney stones   . Hyperlipidemia   . Mitral regurgitation 2012   seen on echo    Past Surgical History:  Procedure Laterality Date  . CARDIOVERSION  2012  . CORONARY ARTERY BYPASS GRAFT     (x5) 12-10-2010  . EXTRACORPOREAL SHOCK WAVE LITHOTRIPSY Left 11/06/2017   Procedure: LEFT EXTRACORPOREAL SHOCK WAVE LITHOTRIPSY (ESWL);  Surgeon: Sebastian Ache, MD;  Location: [facility];  Service: Urology;  Laterality: Left;  . FACIAL FRACTURE SURGERY    . TONSILECTOMY, ADENOIDECTOMY, BILATERAL MYRINGOTOMY AND TUBES      Allergies as of 06/13/2020   No Known Allergies     Medication List       Accurate as of June 13, 2020 11:59 PM. If you have any questions, ask your nurse or doctor.        apixaban 2.5 MG Tabs tablet Commonly known as: Eliquis Take 1 tablet (2.5 mg total) by mouth 2 (two) times daily.   atorvastatin 80 MG tablet Commonly known as: LIPITOR Take 1 tablet (80 mg total) by mouth daily.   finasteride 5 MG tablet Commonly known as: PROSCAR Take 5 mg by mouth daily.    hydrochlorothiazide 12.5 MG tablet Commonly known as: HYDRODIURIL Take 1 tablet (12.5 mg total) by mouth daily.   lisinopril 10 MG tablet Commonly known as: ZESTRIL Take 10 mg by mouth daily.   metoprolol succinate 25 MG 24 hr tablet Commonly known as: TOPROL-XL Take 0.5 tablets (12.5 mg total) by mouth daily.   multivitamin tablet Take 1 tablet by mouth daily.   nitroGLYCERIN 0.4 MG SL tablet Commonly known as: NITROSTAT Place 1 tablet (0.4 mg total) under the tongue every 5 (five) minutes x 3 doses as needed for chest pain.          Objective:   Physical Exam BP 126/82 (BP Location: Right Arm, Patient Position: Sitting, Cuff Size: Small)   Pulse (!) 55   Temp 98 F (36.7 C) (Oral)   Resp 18   Ht 6' (1.829 m)   Wt 170 lb 2 oz (77.2 kg)   SpO2 98%   BMI 23.07 kg/m  General:   Well developed, NAD, BMI noted.  HEENT:  Normocephalic . Face symmetric, atraumatic Lungs:  CTA B Normal respiratory effort, no intercostal retractions, no accessory muscle use. Heart: RRR,  no murmur.  Abdomen:  Not distended, soft, non-tender. No rebound or rigidity.  Palpable nontender aorta, no bruits. Skin: Not pale. Not jaundice Lower  extremities: no pretibial edema bilaterally  Neurologic:  alert & oriented X3.  Speech normal, gait appropriate for age and unassisted Psych--  Cognition and judgment appear intact.  Cooperative with normal attention span and concentration.  Behavior appropriate. No anxious or depressed appearing.     Assessment     Assessment HTN Hyperlipidemia CRI  creat ~ 1.6,  US renal 2015 no obstruction  CV:  Cardiology @ [place] CAD: MI, stents 07-2010. CABG 11-2010. Atrial fib -- cardioversion 2012, on amiodarone  MR per echo  11-2010, no murmur, no need to repeat echo per cardiology note Ectatic aorta per ultrasound 02/2019, recheck 5 years GU: --BPH, Elevated PSA : sees urology, on finasteride -Hematuria, CT and cystoscopy late  2018, both ok,  +urolithiasis( Dr. Mena Goes).  Rx finasteride Dilated abd aorta per [place]  : next [place] 2025 Osteopenia: T score -1.1 (01-2016). 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  Continue HCTZ, lisinopril, metoprolol.  Check CMP and CBC Hyperlipidemia: On Lipitor, check FLP. CAD, A. fib: Anticoagulated, seems to be in sinus rhythm today.  Rec to f/u regularly w/ cardiology BPH: sees urology,  on finasteride, PSA 08/2018: 4.1, decreased compared to previous year. Seen yearly  Osteopenia: Rule out osteoporosis, check DEXA Ectatic aorta: Next ultrasound due 2025 Preventive care reviewed Social: Still thinking about moving to [place] in few months. RTC 6 months   This visit occurred during the SARS-CoV-2 public health emergency.  Safety protocols were in place, including screening questions prior to the visit, additional usage of staff PPE, and extensive cleaning of exam room while observing appropriate contact time as indicated for disinfecting solutions.

## 2020-06-13 NOTE — Patient Instructions (Addendum)
Please consider getting Shingrix (vaccination against shingles)  Please reach out to cardiology and get an appointment  Continue checking your blood pressures at home BP GOAL is between 110/65 and  135/85. If it is consistently higher or lower, let me know     GO TO THE LAB : Get the blood work     GO TO THE FRONT DESK, PLEASE SCHEDULE YOUR APPOINTMENTS Come back for a checkup in 6 months  STOP BY THE FIRST FLOOR: Schedule a bone density test   Advanced care planning   It  is a process that supports adults in  understanding and sharing their preferences regarding future medical care.   The patient's preferences are recorded in documents called Advance Directives.    Advanced directives are completed (and can be modified at any time) while the patient is in full mental capacity.   The documentation should be available at all times to the patient, the family and the healthcare providers.  Bring in a copy to be scanned in your chart is an excellent idea and is recommended   This legal documents direct treatment decision making and/o  appoint a surrogate to make the decision if the patient is not capable to do so.    Advance directives can be documented in many types of formats,  documents have names such as:  Lliving will  Durable power of attorney for healthcare (healthcare proxy or healthcare power of attorney)  Combined directives  Physician orders for life-sustaining treatment  MOST form:  Excellent information is found  "[email or web address]" website.    Information for the state of [place] is found there, the specific form is call MOST (medical orders for scope of treatment )

## 2020-06-14 NOTE — Assessment & Plan Note (Signed)
-  [id number] - [id number], 2021. Prevnar-2016 -shingrex: Recommended -COVID VAX x3 -- CCS: No further screening --PSAs per urology -Advance directives discussed

## 2020-06-14 NOTE — Assessment & Plan Note (Signed)
HTN: BP today is very good, at home in the 130s/70.  Continue HCTZ, lisinopril, metoprolol.  Check CMP and CBC Hyperlipidemia: On Lipitor, check FLP. CAD, A. fib: Anticoagulated, seems to be in sinus rhythm today.  Rec to f/u regularly w/ cardiology BPH: sees urology,  on finasteride, PSA 08/2018: 4.1, decreased compared to previous year. Seen yearly  Osteopenia: Rule out osteoporosis, check DEXA Ectatic aorta: Next ultrasound due 2025 Preventive care reviewed Social: Still thinking about moving to [place] in few months. RTC 6 months

## 2020-06-15 ENCOUNTER — Other Ambulatory Visit: Payer: Self-pay | Admitting: Internal Medicine

## 2020-06-19 ENCOUNTER — Ambulatory Visit (HOSPITAL_BASED_OUTPATIENT_CLINIC_OR_DEPARTMENT_OTHER)
Admission: RE | Admit: 2020-06-19 | Discharge: 2020-06-19 | Disposition: A | Discharge status: Home / Self Care | Payer: Medicare Other | Source: Ambulatory Visit | Attending: Internal Medicine | Admitting: Internal Medicine

## 2020-06-19 ENCOUNTER — Other Ambulatory Visit: Payer: Self-pay

## 2020-06-19 DIAGNOSIS — M85851 Other specified disorders of bone density and structure, right thigh: Secondary | ICD-10-CM | POA: Diagnosis not present

## 2020-06-19 DIAGNOSIS — M810 Age-related osteoporosis without current pathological fracture: Secondary | ICD-10-CM | POA: Diagnosis not present

## 2020-06-19 DIAGNOSIS — N5089 Other specified disorders of the male genital organs: Secondary | ICD-10-CM | POA: Diagnosis not present

## 2020-07-25 DIAGNOSIS — E7800 Pure hypercholesterolemia, unspecified: Secondary | ICD-10-CM | POA: Diagnosis not present

## 2020-07-25 DIAGNOSIS — I2510 Atherosclerotic heart disease of native coronary artery without angina pectoris: Secondary | ICD-10-CM | POA: Diagnosis not present

## 2020-07-25 DIAGNOSIS — Z951 Presence of aortocoronary bypass graft: Secondary | ICD-10-CM | POA: Diagnosis not present

## 2020-07-25 DIAGNOSIS — I480 Paroxysmal atrial fibrillation: Secondary | ICD-10-CM | POA: Diagnosis not present

## 2020-07-25 DIAGNOSIS — I340 Nonrheumatic mitral (valve) insufficiency: Secondary | ICD-10-CM | POA: Diagnosis not present

## 2020-07-25 DIAGNOSIS — I252 Old myocardial infarction: Secondary | ICD-10-CM | POA: Diagnosis not present

## 2020-07-25 DIAGNOSIS — I10 Essential (primary) hypertension: Secondary | ICD-10-CM | POA: Diagnosis not present

## 2020-07-26 DIAGNOSIS — I452 Bifascicular block: Secondary | ICD-10-CM | POA: Diagnosis not present

## 2020-09-18 ENCOUNTER — Telehealth: Payer: Self-pay | Admitting: Internal Medicine

## 2020-09-18 NOTE — Telephone Encounter (Signed)
Attempted to schedule AWV. Unable to LVM.  Will try at later time.  

## 2020-09-18 NOTE — Telephone Encounter (Signed)
Error

## 2020-10-23 ENCOUNTER — Other Ambulatory Visit: Payer: Self-pay | Admitting: Internal Medicine

## 2020-11-13 DIAGNOSIS — R972 Elevated prostate specific antigen [PSA]: Secondary | ICD-10-CM | POA: Diagnosis not present

## 2020-11-20 DIAGNOSIS — N400 Enlarged prostate without lower urinary tract symptoms: Secondary | ICD-10-CM | POA: Diagnosis not present

## 2020-11-20 DIAGNOSIS — N5201 Erectile dysfunction due to arterial insufficiency: Secondary | ICD-10-CM | POA: Diagnosis not present

## 2020-11-20 DIAGNOSIS — R972 Elevated prostate specific antigen [PSA]: Secondary | ICD-10-CM | POA: Diagnosis not present

## 2020-12-14 ENCOUNTER — Ambulatory Visit: Payer: Medicare Other | Admitting: Internal Medicine

## 2021-05-08 ENCOUNTER — Other Ambulatory Visit: Payer: Self-pay | Admitting: Internal Medicine

## 2021-05-09 ENCOUNTER — Other Ambulatory Visit: Payer: Self-pay | Admitting: Internal Medicine

## 2021-09-11 ENCOUNTER — Other Ambulatory Visit: Payer: Self-pay | Admitting: Internal Medicine

## 2021-09-28 ENCOUNTER — Other Ambulatory Visit: Payer: Self-pay | Admitting: Internal Medicine

## 2021-12-31 ENCOUNTER — Other Ambulatory Visit: Payer: Self-pay | Admitting: Internal Medicine

## 2022-03-05 ENCOUNTER — Other Ambulatory Visit: Payer: Self-pay | Admitting: Internal Medicine
